# Patient Record
Sex: Male | Born: 2000
Health system: Southern US, Community
[De-identification: ages and names within clinical notes are randomized; demographics above are authoritative.]

## PROBLEM LIST (undated history)

## (undated) DIAGNOSIS — J45909 Unspecified asthma, uncomplicated: Secondary | ICD-10-CM

## (undated) DIAGNOSIS — F32A Depression, unspecified: Secondary | ICD-10-CM

## (undated) DIAGNOSIS — Z8619 Personal history of other infectious and parasitic diseases: Secondary | ICD-10-CM

## (undated) HISTORY — DX: Personal history of other infectious and parasitic diseases: Z86.19

## (undated) HISTORY — DX: Depression, unspecified: F32.A

## (undated) HISTORY — PX: DENTAL SURGERY: SHX609

## (undated) HISTORY — DX: Unspecified asthma, uncomplicated: J45.909

---
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
## 2019-06-01 ENCOUNTER — Encounter: Payer: Self-pay | Admitting: Family Medicine

## 2019-06-01 ENCOUNTER — Ambulatory Visit: Payer: BLUE CROSS/BLUE SHIELD | Admitting: Family Medicine

## 2019-06-01 ENCOUNTER — Ambulatory Visit (INDEPENDENT_AMBULATORY_CARE_PROVIDER_SITE_OTHER): Payer: BC Managed Care – PPO

## 2019-06-01 ENCOUNTER — Telehealth: Payer: Self-pay | Admitting: *Deleted

## 2019-06-01 ENCOUNTER — Other Ambulatory Visit: Payer: Self-pay

## 2019-06-01 DIAGNOSIS — Z23 Encounter for immunization: Secondary | ICD-10-CM | POA: Diagnosis not present

## 2019-06-01 DIAGNOSIS — M25532 Pain in left wrist: Secondary | ICD-10-CM

## 2019-06-01 DIAGNOSIS — S0081XA Abrasion of other part of head, initial encounter: Secondary | ICD-10-CM | POA: Diagnosis not present

## 2019-06-01 DIAGNOSIS — S6992XA Unspecified injury of left wrist, hand and finger(s), initial encounter: Secondary | ICD-10-CM | POA: Diagnosis not present

## 2019-06-01 DIAGNOSIS — S62102A Fracture of unspecified carpal bone, left wrist, initial encounter for closed fracture: Secondary | ICD-10-CM | POA: Diagnosis not present

## 2019-06-01 NOTE — Telephone Encounter (Signed)
Xray report ready.  Provider aware.

## 2019-06-01 NOTE — Telephone Encounter (Signed)
Diane : Call report L wrist  X-ray is in chart
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
## 2019-06-01 NOTE — Patient Instructions (Addendum)
There is a small chip of the bone on your distal radius/forearm bone at the wrist.  I will refer you to orthopedics to decide if casting or other treatment besides brace is necessary.  Keep the brace in place until seen by orthopedics.  Over-the-counter Tylenol or ibuprofen if needed for pain, ice the area off and on for 10 minutes at a time for the next few days as needed for swelling, and elevate arm when seated.  See information below on abrasions.  Soap and water declines those areas and watch for signs of infection.  Thank you for coming in today.  Stay safe.    Abrasion  An abrasion is a cut or a scrape on the outer surface of the skin. An abrasion does not go through all the layers of the skin. It is important to care for your abrasion properly to prevent infection. What are the causes? This condition is caused by falling on or gliding across the ground or another surface. When your skin rubs on something, the outer and inner layers of skin may rub off. What are the signs or symptoms? The main symptom of this condition is a cut or a scrape. The scrape may be bleeding, or it may appear red or pink. If the abrasion was caused by a fall, there may be a bruise under the cut or scrape. How is this diagnosed? An abrasion is diagnosed with a physical exam. How is this treated? Treatment for this condition depends on how large and deep the abrasion is. In most cases:  Your abrasion will be cleaned with water and mild soap. This is done to remove any dirt or debris (such as particles of glass or rock) that may be stuck in the wound.  An antibiotic ointment may be applied to the abrasion to help prevent infection.  A bandage (dressing) may be placed on the abrasion to keep it clean. You may also need a tetanus shot. Follow these instructions at home: Medicines  Take or apply over-the-counter and prescription medicines only as told by your health care provider.  If you were prescribed an  antibiotic medicine, apply it as told by your health care provider. Wound care  Clean the wound 2-3 times a day, or as directed by your health care provider. To do this, wash the wound with mild soap and water, rinse off the soap, and pat the wound dry with a clean towel. Do not rub the wound.  Keep the dressing clean and dry as told by your health care provider.  There are many different ways to close and cover a wound. Follow instructions from your health care provider about: ? Caring for your wound. ? Changing and removing your dressing. You may have to change your dressing one or more times a day, or as directed by your health care provider.  Check your wound every day for signs of infection. Check for: ? Redness, particularly a red streak that spreads out from the wound. ? Swelling or increased pain. ? Warmth. ? Fluid, pus, or a bad smell.  If directed, put ice on the injured area to reduce pain and swelling: ? Put ice in a plastic bag. ? Place a towel between your skin and the bag. ? Leave the ice on for 20 minutes, 2-3 times a day. General instructions  Do not take baths, swim, or use a hot tub until your health care provider says it is okay to do so.  If possible, raise (elevate)  the injured area above the level of your heart while you are sitting or lying down. This will reduce pain and swelling.  Keep all follow-up visits as directed by your health care provider. This is important. Contact a health care provider if:  You received a tetanus shot, and you have swelling, severe pain, redness, or bleeding at the injection site.  Your pain is not controlled with medicine.  You have redness, swelling, or more pain at the site of your wound. Get help right away if:  You have a red streak spreading away from your wound.  You have a fever.  You have fluid, blood, or pus coming from your wound.  You notice a bad smell coming from your wound or your dressing. Summary  An  abrasion is a cut or a scrape on the outer surface of the skin. An abrasion does not go through all the layers of the skin.  Care for your abrasion properly to prevent infection.  Clean the wound with mild soap and water 2-3 times a day. Follow instructions from your health care provider about taking medicines and changing your bandage (dressing).  Contact your health care provider if you have redness, swelling or more pain in the wound area.  Get help right away if you have a fever or if you have fluid, blood, pus, a bad smell, or a red streak coming from the wound. This information is not intended to replace advice given to you by your health care provider. Make sure you discuss any questions you have with your health care provider. Document Released: 07/29/2005 Document Revised: 10/01/2017 Document Reviewed: 06/02/2017 Elsevier Patient Education  2020 [employer] Inc[employer]  Wrist Splint, Adult A wrist splint is a device that prevents your wrist from moving. A splint supports your wrist like a cast, but it is more flexible. It can be removed or loosened. The supporting part of a splint does not completely surround your wrist. It is held in place with an elastic band or straps. You may need a wrist splint if you have hurt your wrist or if you have a condition that causes swelling. Depending on the type of wrist problem you have, your splint may extend up your arm, onto your hand, or around your thumb. The wrist splint may be worn to:  Support your wrist.  Protect your injury.  Prevent further injury.  Prevent movement.  Reduce pain.  Help with healing. It is important to follow instructions from your health care provider about when to wear the splint to make sure your wrist heals correctly. What are the risks? The most dangerous complication of wearing a splint is having a reduced blood supply to your wrist or hand. This can happen if there is a lot of swelling or if the splint is too tight.  Limited blood supply results in a condition called compartment syndrome and can cause permanent damage. Symptoms include:  Pain that is getting worse.  Tingling and numbness.  Changes in skin color, including paleness or a bluish color.  Cold fingers. Other complications of wearing a splint can include:  Skin irritation that can cause: ? Itching. ? Rash. ? Skin sores. ? Skin infection.  Wrist stiffness. This can occur if you have worn a splint for a long time.  Wrist weakness. How to use your wrist splint  Your wrist splint should be tight enough to support your wrist without blocking your blood supply. How long you need to wear the splint depends on  the type of wrist problem you have. Your health care provider will instruct you about how to wear your wrist splint and how long to wear it. Splint wear  Wear the splint as told by your health care provider. Remove it only as told by your health care provider.  Loosen the splint if your fingers tingle, become numb, or turn cold and blue.  Keep the splint clean.  If the splint is not waterproof: ? Do not let it get wet. ? Cover it with a watertight covering when you take a bath or a shower.  Do not stick anything inside the splint to scratch your skin. Doing that increases your risk of infection.  Check the skin under the splint for any redness or blisters every time you take off the splint. Tell your health care provider about any concerns. Managing pain, stiffness, and swelling  If directed, put ice on the injured area. ? If you a have a removable splint, remove it as told by your health care provider. ? Put ice in a plastic bag. ? Place a towel between your skin and the bag. ? Leave the ice on for 20 minutes, 2-3 times a day.  Move your fingers often to avoid stiffness and to lessen swelling.  Raise (elevate) the injured area above the level of your heart while you are sitting or lying down. Activity  Return to your  normal activities as told by your health care provider. Ask your health care provider what activities are safe for you.  Do exercises as told by your health care provider.  Ask your health care provider when it is safe to drive with a splint on your wrist. General instructions  Do not use the injured limb to support (bear) your body weight until your health care provider says that you can.  Do not put pressure on any part of the splint until it is fully hardened. This may take several hours.  Do not use any products that contain nicotine or tobacco, such as cigarettes and e-cigarettes. If you need help quitting, ask your health care provider.  Take over-the-counter and prescription medicines only as told by your health care provider.  Keep all follow-up visits as told by your health care provider. This is important. Contact a health care provider if:  You have wrist pain or swelling that does not go away.  The skin around or under your splint becomes red, itchy, or moist.  You have chills or a fever.  Your splint feels too tight or too loose.  Your splint gets damaged. Get help right away if:  You have pain that is getting worse.  You have tingling and numbness.  You have changes in skin color, including paleness or a bluish color.  Your fingers are cold. Summary  A wrist splint is a flexible device that supports your wrist and prevents it from moving.  Follow instructions from your health care provider about when to wear the splint to make sure your wrist heals correctly.  Icing, moving your fingers, and raising (elevating) your wrist above the level of your heart will help you manage pain, stiffness, and swelling.  The most dangerous complication of wearing a splint is having a reduced blood supply to your wrist or hand. If your fingers tingle, become numb, or turn cold and blue, loosen the splint and get help right away. This information is not intended to replace  advice given to you by your health care provider. Make sure  you discuss any questions you have with your health care provider. Document Released: 10/01/2006 Document Revised: 02/06/2019 Document Reviewed: 01/06/2017 Elsevier Patient Education  [employer]2020 [employer]   If you have lab work done today you will be contacted with your lab results within the next 2 weeks.  If you have not heard from us then please contact us. The fastest way to get your results is to register for My Chart.   IF you received an x-ray today, you will receive an invoice from [facility]. Please contact [facility] at [phone] with questions or concerns regarding your invoice.   IF you received labwork today, you will receive an invoice from [facility]. Please contact [facility] at [phone] with questions or concerns regarding your invoice.   Our billing staff will not be able to assist you with questions regarding bills from these companies.  You will be contacted with the lab results as soon as they are available. The fastest way to get your results is to activate your My Chart account. Instructions are located on the last page of this paperwork. If you have not heard from us regarding the results in 2 weeks, please contact this office.

## 2019-06-07 ENCOUNTER — Encounter: Payer: Self-pay | Admitting: Orthopaedic Surgery

## 2019-06-07 ENCOUNTER — Ambulatory Visit (INDEPENDENT_AMBULATORY_CARE_PROVIDER_SITE_OTHER): Payer: BC Managed Care – PPO | Admitting: Orthopaedic Surgery

## 2019-06-07 VITALS — Ht 72.0 in | Wt 238.0 lb

## 2019-06-07 DIAGNOSIS — S52501A Unspecified fracture of the lower end of right radius, initial encounter for closed fracture: Secondary | ICD-10-CM | POA: Insufficient documentation

## 2019-06-07 DIAGNOSIS — S52571A Other intraarticular fracture of lower end of right radius, initial encounter for closed fracture: Secondary | ICD-10-CM | POA: Diagnosis not present
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
## 2019-07-11 ENCOUNTER — Ambulatory Visit: Payer: BC Managed Care – PPO | Admitting: Orthopaedic Surgery

## 2019-07-13 ENCOUNTER — Other Ambulatory Visit: Payer: Self-pay

## 2019-07-13 DIAGNOSIS — Z20822 Contact with and (suspected) exposure to covid-19: Secondary | ICD-10-CM

## 2019-07-13 DIAGNOSIS — R6889 Other general symptoms and signs: Secondary | ICD-10-CM | POA: Diagnosis not present

## 2019-07-14 ENCOUNTER — Ambulatory Visit: Payer: BC Managed Care – PPO | Admitting: Orthopaedic Surgery

## 2019-07-14 LAB — NOVEL CORONAVIRUS, NAA: SARS-CoV-2, NAA: NOT DETECTED

## 2019-09-25 DIAGNOSIS — Z20828 Contact with and (suspected) exposure to other viral communicable diseases: Secondary | ICD-10-CM | POA: Diagnosis not present

## 2019-10-06 DIAGNOSIS — Z9189 Other specified personal risk factors, not elsewhere classified: Secondary | ICD-10-CM | POA: Diagnosis not present

## 2019-10-06 DIAGNOSIS — Z20828 Contact with and (suspected) exposure to other viral communicable diseases: Secondary | ICD-10-CM | POA: Diagnosis not present

## 2019-12-08 DIAGNOSIS — H900 Conductive hearing loss, bilateral: Secondary | ICD-10-CM | POA: Diagnosis not present

## 2019-12-08 DIAGNOSIS — H6123 Impacted cerumen, bilateral: Secondary | ICD-10-CM | POA: Diagnosis not present

## 2019-12-14 DIAGNOSIS — R05 Cough: Secondary | ICD-10-CM | POA: Diagnosis not present

## 2020-02-15 ENCOUNTER — Ambulatory Visit: Payer: BC Managed Care – PPO | Admitting: Family Medicine

## 2020-02-29 ENCOUNTER — Other Ambulatory Visit: Payer: Self-pay

## 2020-02-29 ENCOUNTER — Ambulatory Visit: Payer: BC Managed Care – PPO | Admitting: Family Medicine

## 2020-02-29 ENCOUNTER — Encounter: Payer: Self-pay | Admitting: Family Medicine

## 2020-02-29 VITALS — BP 122/80 | HR 82 | Temp 98.5°F | Ht 72.0 in | Wt 241.0 lb

## 2020-02-29 DIAGNOSIS — S00411A Abrasion of right ear, initial encounter: Secondary | ICD-10-CM | POA: Diagnosis not present

## 2020-02-29 DIAGNOSIS — H6121 Impacted cerumen, right ear: Secondary | ICD-10-CM | POA: Diagnosis not present

## 2020-02-29 MED ORDER — OFLOXACIN 0.3 % OT SOLN: 5.0000 [drp] | Freq: Every day | OTIC | 0 refills | Status: DC

## 2020-02-29 NOTE — Patient Instructions (Addendum)
There is a small abrasion on the outside part of the ear canal. Use 5 drops of antibiotic once per day for the next week. If any increased pain, swelling, headaches, or worsening symptoms return for recheck here or urgent care/emergency room if needed.  Small amount of wax was removed from the left ear, but I do not expect that to be an issue. Try to avoid use of Q-tips within the ear canal itself. If blockages from ear wax in the future, can use over-the-counter Debrox or we can treat in our office again.  Return to the clinic or go to the nearest emergency room if any of your symptoms worsen or new symptoms occur.    Earwax Buildup, Adult The ears produce a substance called earwax that helps keep bacteria out of the ear and protects the skin in the ear canal. Occasionally, earwax can build up in the ear and cause discomfort or hearing loss. What increases the risk? This condition is more likely to develop in people who:  Are male.  Are elderly.  Naturally produce more earwax.  Clean their ears often with cotton swabs.  Use earplugs often.  Use in-ear headphones often.  Wear hearing aids.  Have narrow ear canals.  Have earwax that is overly thick or sticky.  Have eczema.  Are dehydrated.  Have excess hair in the ear canal. What are the signs or symptoms? Symptoms of this condition include:  Reduced or muffled hearing.  A feeling of fullness in the ear or feeling that the ear is plugged.  Fluid coming from the ear.  Ear pain.  Ear itch.  Ringing in the ear.  Coughing.  An obvious piece of earwax that can be seen inside the ear canal. How is this diagnosed? This condition may be diagnosed based on:  Your symptoms.  Your medical history.  An ear exam. During the exam, your health care provider will look into your ear with an instrument called an otoscope. You may have tests, including a hearing test. How is this treated? This condition may be treated  by:  Using ear drops to soften the earwax.  Having the earwax removed by a health care provider. The health care provider may: ? Flush the ear with water. ? Use an instrument that has a loop on the end (curette). ? Use a suction device.  Surgery to remove the wax buildup. This may be done in severe cases. Follow these instructions at home:   Take over-the-counter and prescription medicines only as told by your health care provider.  Do not put any objects, including cotton swabs, into your ear. You can clean the opening of your ear canal with a washcloth or facial tissue.  Follow instructions from your health care provider about cleaning your ears. Do not over-clean your ears.  Drink enough fluid to keep your urine clear or pale yellow. This will help to thin the earwax.  Keep all follow-up visits as told by your health care provider. If earwax builds up in your ears often or if you use hearing aids, consider seeing your health care provider for routine, preventive ear cleanings. Ask your health care provider how often you should schedule your cleanings.  If you have hearing aids, clean them according to instructions from the manufacturer and your health care provider. Contact a health care provider if:  You have ear pain.  You develop a fever.  You have blood, pus, or other fluid coming from your ear.  You  have hearing loss.  You have ringing in your ears that does not go away.  Your symptoms do not improve with treatment.  You feel like the room is spinning (vertigo). Summary  Earwax can build up in the ear and cause discomfort or hearing loss.  The most common symptoms of this condition include reduced or muffled hearing and a feeling of fullness in the ear or feeling that the ear is plugged.  This condition may be diagnosed based on your symptoms, your medical history, and an ear exam.  This condition may be treated by using ear drops to soften the earwax or by  having the earwax removed by a health care provider.  Do not put any objects, including cotton swabs, into your ear. You can clean the opening of your ear canal with a washcloth or facial tissue. This information is not intended to replace advice given to you by your health care provider. Make sure you discuss any questions you have with your health care provider. Document Revised: 10/01/2017 Document Reviewed: 12/30/2016 Elsevier Patient Education  [employer].   If you have lab work done today you will be contacted with your lab results within the next 2 weeks.  If you have not heard from [place] then please contact us. The fastest way to get your results is to register for My Chart.   IF you received an x-ray today, you will receive an invoice from [facility]. Please contact [facility] at [phone] with questions or concerns regarding your invoice.   IF you received labwork today, you will receive an invoice from [place]. Please contact [facility] at [phone] with questions or concerns regarding your invoice.   Our billing staff will not be able to assist you with questions regarding bills from these companies.  You will be contacted with the lab results as soon as they are available. The fastest way to get your results is to activate your My Chart account. Instructions are located on the last page of this paperwork. If you have not heard from [place] regarding the results in 2 weeks, please contact this office.

## 2020-02-29 NOTE — Progress Notes (Signed)
Subjective:  Patient ID: James Dawson, male    DOB: May 16, 2001  Age: 19 y.o. MRN: [id number]  CC:  Chief Complaint  Patient presents with  . ear cloged    pt states his R ear is cloged. pt tried to clear ear with a Q-tip and casued some bleeding. pt is having a hard time hearing out of R ear. and feels some pressure in and around the ear. no discharge or fluid habe came out of ear. pt dose have mini headaches shortly after the ear was clogged.    HPI James Dawson presents for   R ear clogged: Started 3 days ago - min blockage - used qtip to clean. smal amount of blood noted - thought he may have scratched. Bleeding stopped in , not returned. No other d/c.  R ear muffled since 2 days ago. - same past 2 days. No prior tympanostomy tube or trauma.  Occasional slight R sided HA.  No fever. 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       History Patient Active Problem List   Diagnosis Date Noted  . Closed fracture of right distal radius 06/07/2019   Past Medical History:  Diagnosis Date  . Asthma    Past Surgical History:  Procedure Laterality Date  . DENTAL SURGERY     No Known Allergies Prior to Admission medications   Not on File   Social History   Socioeconomic History  . Marital status: Significant Other    Spouse name: Not on file  . Number of children: Not on file  . Years of education: Not on file  . Highest education level: Not on file  Occupational History  . Not on file  Tobacco Use  . Smoking status: Never Smoker  . Smokeless tobacco: Never Used  Substance and Sexual Activity  . Alcohol use: No  . Drug use: No  . Sexual activity: Not on file  Other Topics Concern  . Not on file  Social History Narrative  . Not on file   Social Determinants of Health   Financial Resource Strain:   . Difficulty of Paying Living Expenses:   Food Insecurity:   . Worried About Programme researcher, broadcasting/film/video in the Last Year:   . Barista  in the Last Year:   Transportation Needs:   . Freight forwarder (Medical):   Marland Kitchen Lack of Transportation (Non-Medical):   Physical Activity:   . Days of Exercise per Week:   . Minutes of Exercise per Session:   Stress:   . Feeling of Stress :   Social Connections:   . Frequency of Communication with Friends and Family:   . Frequency of Social Gatherings with Friends and Family:   . Attends Religious Services:   . Active Member of Clubs or Organizations:   . Attends Banker Meetings:   Marland Kitchen Marital Status:   Intimate Partner Violence:   . Fear of Current or Ex-Partner:   . Emotionally Abused:   Marland Kitchen Physically Abused:   . Sexually Abused:     Review of Systems  Constitutional: Negative for fever.  HENT: Positive for hearing loss. Negative for ear discharge, ear pain and facial swelling.      Objective:   Vitals:   02/29/20 1410  BP: 122/80  Pulse: 82  Temp: 98.5 F (36.9 C)  TempSrc: Temporal  SpO2: 98%  Weight: 241 lb (109.3 kg)  Height: 6' (1.829 m)  Physical Exam Constitutional:      General: He is not in acute distress.    Appearance: He is well-developed.  HENT:     Head: Normocephalic and atraumatic.     Ears:     Comments: Left external ear, nontender minimal dark cerumen in canal, nonobstructive. Small amount centrally was removed with white curette without difficulty, no complications.  Right ear, pinna nontender, no pain with traction. No surrounding lymphadenopathy or erythema. No rash. Canal with small abrasion, dried blood at approximately 3:00 in the external canal. Canal obstructed with light yellow cerumen. No apparent canal edema or exudate. Unable to visualize TM Cardiovascular:     Rate and Rhythm: Normal rate.  Pulmonary:     Effort: Pulmonary effort is normal.  Neurological:     Mental Status: He is alert and oriented to person, place, and time.     3:52 PM Exam after cerumen lavage: He has regained normal hearing on  right. Small abrasion remains at 3:00, TM pearly gray without sign of rupture or injury.   Assessment & Plan:  James Dawson is a 19 y.o. male . Hearing loss of right ear due to cerumen impaction - Plan: Ear wax removal  Abrasion of right ear canal, initial encounter - Plan: ofloxacin (FLOXIN OTIC) 0.3 % OTIC solution Cerumen impaction with small abrasion while trying to clean at home.  No sign of tympanic membrane rupture or injury.  Lavage performed in office without complication, resolution of hearing loss.  Floxin otic for abrasion with RTC precautions.  Meds ordered this encounter  Medications  . ofloxacin (FLOXIN OTIC) 0.3 % OTIC solution    Sig: Place 5 drops into the left ear daily.    Dispense:  5 mL    Refill:  0   Patient Instructions     There is a small abrasion on the outside part of the ear canal. Use 5 drops of antibiotic once per day for the next week. If any increased pain, swelling, headaches, or worsening symptoms return for recheck here or urgent care/emergency room if needed.  Small amount of wax was removed from the left ear, but I do not expect that to be an issue. Try to avoid use of Q-tips within the ear canal itself. If blockages from ear wax in the future, can use over-the-counter Debrox or we can treat in our office again.  Return to the clinic or go to the nearest emergency room if any of your symptoms worsen or new symptoms occur.    Earwax Buildup, Adult The ears produce a substance called earwax that helps keep bacteria out of the ear and protects the skin in the ear canal. Occasionally, earwax can build up in the ear and cause discomfort or hearing loss. What increases the risk? This condition is more likely to develop in people who:  Are male.  Are elderly.  Naturally produce more earwax.  Clean their ears often with cotton swabs.  Use earplugs often.  Use in-ear headphones often.  Wear hearing aids.  Have narrow ear canals.  Have  earwax that is overly thick or sticky.  Have eczema.  Are dehydrated.  Have excess hair in the ear canal. What are the signs or symptoms? Symptoms of this condition include:  Reduced or muffled hearing.  A feeling of fullness in the ear or feeling that the ear is plugged.  Fluid coming from the ear.  Ear pain.  Ear itch.  Ringing in the ear.  Coughing.  An obvious piece of earwax that can be seen inside the ear canal. How is this diagnosed? This condition may be diagnosed based on:  Your symptoms.  Your medical history.  An ear exam. During the exam, your health care provider will look into your ear with an instrument called an otoscope. You may have tests, including a hearing test. How is this treated? This condition may be treated by:  Using ear drops to soften the earwax.  Having the earwax removed by a health care provider. The health care provider may: ? Flush the ear with water. ? Use an instrument that has a loop on the end (curette). ? Use a suction device.  Surgery to remove the wax buildup. This may be done in severe cases. Follow these instructions at home:   Take over-the-counter and prescription medicines only as told by your health care provider.  Do not put any objects, including cotton swabs, into your ear. You can clean the opening of your ear canal with a washcloth or facial tissue.  Follow instructions from your health care provider about cleaning your ears. Do not over-clean your ears.  Drink enough fluid to keep your urine clear or pale yellow. This will help to thin the earwax.  Keep all follow-up visits as told by your health care provider. If earwax builds up in your ears often or if you use hearing aids, consider seeing your health care provider for routine, preventive ear cleanings. Ask your health care provider how often you should schedule your cleanings.  If you have hearing aids, clean them according to instructions from the  manufacturer and your health care provider. Contact a health care provider if:  You have ear pain.  You develop a fever.  You have blood, pus, or other fluid coming from your ear.  You have hearing loss.  You have ringing in your ears that does not go away.  Your symptoms do not improve with treatment.  You feel like the room is spinning (vertigo). Summary  Earwax can build up in the ear and cause discomfort or hearing loss.  The most common symptoms of this condition include reduced or muffled hearing and a feeling of fullness in the ear or feeling that the ear is plugged.  This condition may be diagnosed based on your symptoms, your medical history, and an ear exam.  This condition may be treated by using ear drops to soften the earwax or by having the earwax removed by a health care provider.  Do not put any objects, including cotton swabs, into your ear. You can clean the opening of your ear canal with a washcloth or facial tissue. This information is not intended to replace advice given to you by your health care provider. Make sure you discuss any questions you have with your health care provider. Document Revised: 10/01/2017 Document Reviewed: 12/30/2016 Elsevier Patient Education  [employer].   If you have lab work done today you will be contacted with your lab results within the next 2 weeks.  If you have not heard from [place] then please contact us. The fastest way to get your results is to register for My Chart.   IF you received an x-ray today, you will receive an invoice from [facility]. Please contact [facility] at [phone] with questions or concerns regarding your invoice.   IF you received labwork today, you will receive an invoice from [facility]. Please contact [facility] at [phone] with questions or concerns regarding your  invoice.   Our billing staff will not be able to assist you with questions regarding bills from these  companies.  You will be contacted with the lab results as soon as they are available. The fastest way to get your results is to activate your My Chart account. Instructions are located on the last page of this paperwork. If you have not heard from [place] regarding the results in 2 weeks, please contact this office.         Signed, Merri Ray, MD [facility]

## 2020-03-14 ENCOUNTER — Ambulatory Visit: Payer: BC Managed Care – PPO | Admitting: Family Medicine

## 2020-03-21 ENCOUNTER — Ambulatory Visit: Payer: BC Managed Care – PPO | Admitting: Family Medicine

## 2020-04-26 ENCOUNTER — Telehealth: Payer: BC Managed Care – PPO | Admitting: Family Medicine

## 2020-05-02 ENCOUNTER — Other Ambulatory Visit: Payer: Self-pay

## 2020-05-02 ENCOUNTER — Ambulatory Visit: Payer: BC Managed Care – PPO | Admitting: Family Medicine

## 2020-05-02 ENCOUNTER — Encounter: Payer: Self-pay | Admitting: Family Medicine

## 2020-05-02 VITALS — BP 126/82 | HR 90 | Temp 97.8°F | Ht 72.0 in | Wt 246.0 lb

## 2020-05-02 DIAGNOSIS — H6002 Abscess of left external ear: Secondary | ICD-10-CM | POA: Diagnosis not present

## 2020-05-02 MED ORDER — DOXYCYCLINE HYCLATE 100 MG PO TABS: 100.0000 mg | ORAL_TABLET | Freq: Two times a day (BID) | ORAL | 0 refills | Status: DC

## 2020-05-02 NOTE — Progress Notes (Signed)
Subjective:  Patient ID: James Dawson, male    DOB: 2001/10/11  Age: 19 y.o. MRN: [id number]  CC:  Chief Complaint  Patient presents with  . Ear Pain    Pt reports he thinks he has an ingrown hair In his L ear that is causing wax build-up. Pt states his ear is tender to the touch and the L side of his jaw hurts when he opens his mouth.    HPI James Dawson presents for   Noticed pimple on L ear few days ago - inside/front part. Area popped open while waiting in office, blood after small amount Hearing ok.  No fevers.  No face swelling. Sore to jaw area at TNL only. Min tightness.   Tx: leftover floxin otic. Few drops once.   History Patient Active Problem List   Diagnosis Date Noted  . Closed fracture of right distal radius 06/07/2019   Past Medical History:  Diagnosis Date  . Asthma    Past Surgical History:  Procedure Laterality Date  . DENTAL SURGERY     No Known Allergies Prior to Admission medications   Medication Sig Start Date End Date Taking? Authorizing Provider  ofloxacin (FLOXIN OTIC) 0.3 % OTIC solution Place 5 drops into the left ear daily. 02/29/20   Shade Flood, MD   Social History   Socioeconomic History  . Marital status: Significant Other    Spouse name: Not on file  . Number of children: Not on file  . Years of education: Not on file  . Highest education level: Not on file  Occupational History  . Not on file  Tobacco Use  . Smoking status: Never Smoker  . Smokeless tobacco: Never Used  Substance and Sexual Activity  . Alcohol use: No  . Drug use: No  . Sexual activity: Not on file  Other Topics Concern  . Not on file  Social History Narrative  . Not on file   Social Determinants of Health   Financial Resource Strain:   . Difficulty of Paying Living Expenses:   Food Insecurity:   . Worried About Programme researcher, broadcasting/film/video in the Last Year:   . Barista in the Last Year:   Transportation Needs:   . Freight forwarder  (Medical):   Marland Kitchen Lack of Transportation (Non-Medical):   Physical Activity:   . Days of Exercise per Week:   . Minutes of Exercise per Session:   Stress:   . Feeling of Stress :   Social Connections:   . Frequency of Communication with Friends and Family:   . Frequency of Social Gatherings with Friends and Family:   . Attends Religious Services:   . Active Member of Clubs or Organizations:   . Attends Banker Meetings:   Marland Kitchen Marital Status:   Intimate Partner Violence:   . Fear of Current or Ex-Partner:   . Emotionally Abused:   Marland Kitchen Physically Abused:   . Sexually Abused:     Review of Systems Per HPI.   Objective:   Vitals:   05/02/20 1413 05/02/20 1416  BP: (!) 144/85 126/82  Pulse: 90   Temp: 97.8 F (36.6 C)   TempSrc: Temporal   SpO2: 97%   Weight: 246 lb (111.6 kg)   Height: 6' (1.829 m)      Physical Exam Constitutional:      General: He is not in acute distress.    Appearance: He is well-developed.  HENT:  Head: Normocephalic and atraumatic.     Ears:     Comments: Small pustule just inside of the canal on the left, anterior aspect.  No appreciable fluctuance, no discharge with pressure.  Minimal tenderness palpation.  TMJ without focal bony tenderness, able to open and close jaw without difficulty.  No face swelling or erythema. Cardiovascular:     Rate and Rhythm: Normal rate.  Pulmonary:     Effort: Pulmonary effort is normal.  Neurological:     Mental Status: He is alert and oriented to person, place, and time.        Assessment & Plan:  James Dawson is a 19 y.o. male . Abscess of left ear canal - Plan: doxycycline (VIBRA-TABS) 100 MG tablet Suspected mild/early abscess of left canal that ruptured on its own in the office.  No active drainage or discharge at this time.  Cover with doxycycline 100 g twice daily x1 week.  Side effects discussed.  Warm compresses, symptomatic care with RTC/ER/urgent care precautions.  Meds ordered  this encounter  Medications  . doxycycline (VIBRA-TABS) 100 MG tablet    Sig: Take 1 tablet (100 mg total) by mouth 2 (two) times daily.    Dispense:  14 tablet    Refill:  0   Patient Instructions   I suspect you had a small abscess forming just inside the ear canal.  It appears to be improving at this time.  Okay to apply warm compresses to area few times per day with gentle pressure to express any remaining pus.  Start antibiotic 1 pill twice per day.  If any increasing swelling or worsening symptoms be seen here or urgent care/emergency room.  Let me know if there are questions and have a safe trip  Skin Abscess  A skin abscess is an infected area on or under your skin that contains a collection of pus and other material. An abscess may also be called a furuncle, carbuncle, or boil. An abscess can occur in or on almost any part of your body. Some abscesses break open (rupture) on their own. Most continue to get worse unless they are treated. The infection can spread deeper into the body and eventually into your blood, which can make you feel ill. Treatment usually involves draining the abscess. What are the causes? An abscess occurs when germs, like bacteria, pass through your skin and cause an infection. This may be caused by:  A scrape or cut on your skin.  A puncture wound through your skin, including a needle injection or insect bite.  Blocked oil or sweat glands.  Blocked and infected hair follicles.  A cyst that forms beneath your skin (sebaceous cyst) and becomes infected. What increases the risk? This condition is more likely to develop in people who:  Have a weak body defense system (immune system).  Have diabetes.  Have dry and irritated skin.  Get frequent injections or use illegal IV drugs.  Have a foreign body in a wound, such as a splinter.  Have problems with their lymph system or veins. What are the signs or symptoms? Symptoms of this condition  include:  A painful, firm bump under the skin.  A bump with pus at the top. This may break through the skin and drain. Other symptoms include:  Redness surrounding the abscess site.  Warmth.  Swelling of the lymph nodes (glands) near the abscess.  Tenderness.  A sore on the skin. How is this diagnosed? This condition may be diagnosed  based on:  A physical exam.  Your medical history.  A sample of pus. This may be used to find out what is causing the infection.  Blood tests.  Imaging tests, such as an ultrasound, CT scan, or MRI. How is this treated? A small abscess that drains on its own may not need treatment. Treatment for larger abscesses may include:  Moist heat or heat pack applied to the area several times a day.  A procedure to drain the abscess (incision and drainage).  Antibiotic medicines. For a severe abscess, you may first get antibiotics through an IV and then change to antibiotics by mouth. Follow these instructions at home: Medicines   Take over-the-counter and prescription medicines only as told by your health care provider.  If you were prescribed an antibiotic medicine, take it as told by your health care provider. Do not stop taking the antibiotic even if you start to feel better. Abscess care   If you have an abscess that has not drained, apply heat to the affected area. Use the heat source that your health care provider recommends, such as a moist heat pack or a heating pad. ? Place a towel between your skin and the heat source. ? Leave the heat on for 20-30 minutes. ? Remove the heat if your skin turns bright red. This is especially important if you are unable to feel pain, heat, or cold. You may have a greater risk of getting burned.  Follow instructions from your health care provider about how to take care of your abscess. Make sure you: ? Cover the abscess with a bandage (dressing). ? Change your dressing or gauze as told by your health  care provider. ? Wash your hands with soap and water before you change the dressing or gauze. If soap and water are not available, use hand sanitizer.  Check your abscess every day for signs of a worsening infection. Check for: ? More redness, swelling, or pain. ? More fluid or blood. ? Warmth. ? More pus or a bad smell. General instructions  To avoid spreading the infection: ? Do not share personal care items, towels, or hot tubs with others. ? Avoid making skin contact with other people.  Keep all follow-up visits as told by your health care provider. This is important. Contact a health care provider if you have:  More redness, swelling, or pain around your abscess.  More fluid or blood coming from your abscess.  Warm skin around your abscess.  More pus or a bad smell coming from your abscess.  A fever.  Muscle aches.  Chills or a general ill feeling. Get help right away if you:  Have severe pain.  See red streaks on your skin spreading away from the abscess. Summary  A skin abscess is an infected area on or under your skin that contains a collection of pus and other material.  A small abscess that drains on its own may not need treatment.  Treatment for larger abscesses may include having a procedure to drain the abscess and taking an antibiotic. This information is not intended to replace advice given to you by your health care provider. Make sure you discuss any questions you have with your health care provider. Document Revised: 02/09/2019 Document Reviewed: 12/02/2017 Elsevier Patient Education  [employer].    If you have lab work done today you will be contacted with your lab results within the next 2 weeks.  If you have not heard from [place]  then please contact us. The fastest way to get your results is to register for My Chart.   IF you received an x-ray today, you will receive an invoice from [facility]. Please contact [facility]  at [phone] with questions or concerns regarding your invoice.   IF you received labwork today, you will receive an invoice from [facility]. Please contact [facility] at [phone] with questions or concerns regarding your invoice.   Our billing staff will not be able to assist you with questions regarding bills from these companies.  You will be contacted with the lab results as soon as they are available. The fastest way to get your results is to activate your My Chart account. Instructions are located on the last page of this paperwork. If you have not heard from us regarding the results in 2 weeks, please contact this office.         Signed, Meredith StaggersJeffrey Estalee Mccandlish, MD [facility]

## 2020-05-02 NOTE — Patient Instructions (Addendum)
I suspect you had a small abscess forming just inside the ear canal.  It appears to be improving at this time.  Okay to apply warm compresses to area few times per day with gentle pressure to express any remaining pus.  Start antibiotic 1 pill twice per day.  If any increasing swelling or worsening symptoms be seen here or urgent care/emergency room.  Let me know if there are questions and have a safe trip  Skin Abscess  A skin abscess is an infected area on or under your skin that contains a collection of pus and other material. An abscess may also be called a furuncle, carbuncle, or boil. An abscess can occur in or on almost any part of your body. Some abscesses break open (rupture) on their own. Most continue to get worse unless they are treated. The infection can spread deeper into the body and eventually into your blood, which can make you feel ill. Treatment usually involves draining the abscess. What are the causes? An abscess occurs when germs, like bacteria, pass through your skin and cause an infection. This may be caused by:  A scrape or cut on your skin.  A puncture wound through your skin, including a needle injection or insect bite.  Blocked oil or sweat glands.  Blocked and infected hair follicles.  A cyst that forms beneath your skin (sebaceous cyst) and becomes infected. What increases the risk? This condition is more likely to develop in people who:  Have a weak body defense system (immune system).  Have diabetes.  Have dry and irritated skin.  Get frequent injections or use illegal IV drugs.  Have a foreign body in a wound, such as a splinter.  Have problems with their lymph system or veins. What are the signs or symptoms? Symptoms of this condition include:  A painful, firm bump under the skin.  A bump with pus at the top. This may break through the skin and drain. Other symptoms include:  Redness surrounding the abscess site.  Warmth.  Swelling of the  lymph nodes (glands) near the abscess.  Tenderness.  A sore on the skin. How is this diagnosed? This condition may be diagnosed based on:  A physical exam.  Your medical history.  A sample of pus. This may be used to find out what is causing the infection.  Blood tests.  Imaging tests, such as an ultrasound, CT scan, or MRI. How is this treated? A small abscess that drains on its own may not need treatment. Treatment for larger abscesses may include:  Moist heat or heat pack applied to the area several times a day.  A procedure to drain the abscess (incision and drainage).  Antibiotic medicines. For a severe abscess, you may first get antibiotics through an IV and then change to antibiotics by mouth. Follow these instructions at home: Medicines   Take over-the-counter and prescription medicines only as told by your health care provider.  If you were prescribed an antibiotic medicine, take it as told by your health care provider. Do not stop taking the antibiotic even if you start to feel better. Abscess care   If you have an abscess that has not drained, apply heat to the affected area. Use the heat source that your health care provider recommends, such as a moist heat pack or a heating pad. ? Place a towel between your skin and the heat source. ? Leave the heat on for 20-30 minutes. ? Remove the heat if your  skin turns bright red. This is especially important if you are unable to feel pain, heat, or cold. You may have a greater risk of getting burned.  Follow instructions from your health care provider about how to take care of your abscess. Make sure you: ? Cover the abscess with a bandage (dressing). ? Change your dressing or gauze as told by your health care provider. ? Wash your hands with soap and water before you change the dressing or gauze. If soap and water are not available, use hand sanitizer.  Check your abscess every day for signs of a worsening infection.  Check for: ? More redness, swelling, or pain. ? More fluid or blood. ? Warmth. ? More pus or a bad smell. General instructions  To avoid spreading the infection: ? Do not share personal care items, towels, or hot tubs with others. ? Avoid making skin contact with other people.  Keep all follow-up visits as told by your health care provider. This is important. Contact a health care provider if you have:  More redness, swelling, or pain around your abscess.  More fluid or blood coming from your abscess.  Warm skin around your abscess.  More pus or a bad smell coming from your abscess.  A fever.  Muscle aches.  Chills or a general ill feeling. Get help right away if you:  Have severe pain.  See red streaks on your skin spreading away from the abscess. Summary  A skin abscess is an infected area on or under your skin that contains a collection of pus and other material.  A small abscess that drains on its own may not need treatment.  Treatment for larger abscesses may include having a procedure to drain the abscess and taking an antibiotic. This information is not intended to replace advice given to you by your health care provider. Make sure you discuss any questions you have with your health care provider. Document Revised: 02/09/2019 Document Reviewed: 12/02/2017 Elsevier Patient Education  [employer].    If you have lab work done today you will be contacted with your lab results within the next 2 weeks.  If you have not heard from [place] then please contact us. The fastest way to get your results is to register for My Chart.   IF you received an x-ray today, you will receive an invoice from [facility]. Please contact [facility] at [phone] with questions or concerns regarding your invoice.   IF you received labwork today, you will receive an invoice from [facility]. Please contact [facility] at [phone] with questions or concerns regarding  your invoice.   Our billing staff will not be able to assist you with questions regarding bills from these companies.  You will be contacted with the lab results as soon as they are available. The fastest way to get your results is to activate your My Chart account. Instructions are located on the last page of this paperwork. If you have not heard from [place] regarding the results in 2 weeks, please contact this office.

## 2020-07-26 ENCOUNTER — Other Ambulatory Visit: Payer: Self-pay

## 2020-07-26 ENCOUNTER — Telehealth (INDEPENDENT_AMBULATORY_CARE_PROVIDER_SITE_OTHER): Payer: BC Managed Care – PPO | Admitting: Family Medicine

## 2020-07-26 ENCOUNTER — Encounter: Payer: Self-pay | Admitting: Family Medicine

## 2020-07-26 VITALS — Temp 96.4°F | Ht 72.0 in | Wt 240.0 lb

## 2020-07-26 DIAGNOSIS — R05 Cough: Secondary | ICD-10-CM | POA: Diagnosis not present

## 2020-07-26 DIAGNOSIS — J069 Acute upper respiratory infection, unspecified: Secondary | ICD-10-CM | POA: Diagnosis not present

## 2020-07-26 NOTE — Progress Notes (Signed)
   Virtual Visit Note  I connected with patient on 07/26/20 at 906am by video epic and verified that I am speaking with the correct person using two identifiers. James Dawson is currently located at home and patient is currently with them during visit. The provider, Myles Lipps, MD is located in their office at time of visit.  I discussed the limitations, risks, security and privacy concerns of performing an evaluation and management service by telephone and the availability of in person appointments. I also discussed with the patient that there may be a patient responsible charge related to this service. The patient expressed understanding and agreed to proceed.   I provided 10 minutes of non-face-to-face time during this encounter.  Chief Complaint  Patient presents with  . Nasal Congestion    sinus congestion, and drainage. (going on since tue night)  . top of throat swelling    swollen and dry. Worse in the morning.     HPI ? Has been having cold sx since changes in weather He has been having nasal congestion, sore throat, coughing x 4 days Minimal sneezing and no itchy eyes No fever or chills, no SOB Mild ear stuffiness but from ear wax occ headaches, had body aches first 2 days  No loss of taste or smell Mild nausea first 2 days, resolved now No vomiting or diarrhea No sick contacts Took mucinex last night - helped Has not been to school or work since he got sick Has been vaccinated against covid Has not had this flu season vaccine   No Known Allergies  Prior to Admission medications   Not on File    Past Medical History:  Diagnosis Date  . Asthma     Past Surgical History:  Procedure Laterality Date  . DENTAL SURGERY      Social History   Tobacco Use  . Smoking status: Never Smoker  . Smokeless tobacco: Never Used  Substance Use Topics  . Alcohol use: No    Family History  Problem Relation Age of Onset  . Mental illness Sister      ROS Per hpi  Objective  Vitals as reported by the patient:  Temperature (!) 96.4 F (35.8 C), temperature source Oral, height 6' (1.829 m), weight 240 lb (108.9 kg).  GEN: AAOx3, NAD HEENT: [place]/AT, pupils are symmetrical, EOMI, non-icteric sclera Resp: breathing comfortably, speaking in full sentences Skin: no rashes noted, no pallor Psych: good eye contact, normal mood and affect   ASSESSMENT and PLAN  1. Acute upper respiratory infection Discussed supportive measures and RTC precautions. Patient given information for covid testing. Work/school excuse will be given until neg covid test or if positive meets 10 day quarantine guidelines.  FOLLOW-UP: prn   The above assessment and management plan was discussed with the patient. The patient verbalized understanding of and has agreed to the management plan. Patient is aware to call the clinic if symptoms persist or worsen. Patient is aware when to return to the clinic for a follow-up visit. Patient educated on when it is appropriate to go to the emergency department.     Myles Lipps, MD Primary Care at [facility] [address] [place], [place] [address] Ph.  [phone] Fax [phone]

## 2020-07-26 NOTE — Patient Instructions (Signed)
° ° ° °  If you have lab work done today you will be contacted with your lab results within the next 2 weeks.  If you have not heard from us then please contact us. The fastest way to get your results is to register for My Chart. ° ° °IF you received an x-ray today, you will receive an invoice from [facility]. Please contact [facility] at [phone] with questions or concerns regarding your invoice.  ° °IF you received labwork today, you will receive an invoice from [facility]. Please contact [facility] at [phone] with questions or concerns regarding your invoice.  ° °Our billing staff will not be able to assist you with questions regarding bills from these companies. ° °You will be contacted with the lab results as soon as they are available. The fastest way to get your results is to activate your My Chart account. Instructions are located on the last page of this paperwork. If you have not heard from us regarding the results in 2 weeks, please contact this office. °  ° ° ° °

## 2020-11-09 ENCOUNTER — Encounter: Payer: Self-pay | Admitting: Family Medicine

## 2020-12-18 ENCOUNTER — Ambulatory Visit: Payer: 59 | Admitting: Family Medicine

## 2020-12-18 ENCOUNTER — Other Ambulatory Visit: Payer: Self-pay

## 2020-12-18 ENCOUNTER — Encounter: Payer: Self-pay | Admitting: Family Medicine

## 2020-12-18 VITALS — BP 147/84 | HR 94 | Temp 98.7°F | Ht 72.0 in | Wt 259.0 lb

## 2020-12-18 DIAGNOSIS — H6123 Impacted cerumen, bilateral: Secondary | ICD-10-CM

## 2020-12-18 DIAGNOSIS — R599 Enlarged lymph nodes, unspecified: Secondary | ICD-10-CM | POA: Diagnosis not present

## 2020-12-18 MED ORDER — CIPROFLOXACIN-DEXAMETHASONE 0.3-0.1 % OT SUSP: 4.0000 [drp] | Freq: Two times a day (BID) | OTIC | 0 refills | Status: AC

## 2020-12-18 NOTE — Progress Notes (Signed)
2/16/20222:31 PM  James Dawson 26-Mar-2001, 20 y.o., male [id number]  Chief Complaint  Patient presents with  . left jaw swollen lymph node    Along with left ear pain x 2 days - recent exp to allergen     HPI:   Patient is a 20 y.o. male with no significant past medical history who presents today for swollen jaw and ear pain.  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 0 0 0 0 1  Number falls in past yr: 0 0 - - 0  Injury with Fall? 0 0 - - 1  Comment - - - - left side of face and left wrist  Follow up Falls evaluation completed Falls evaluation completed Falls evaluation completed Falls evaluation completed Falls evaluation completed     No Known Allergies  Prior to Admission medications   Not on File    Past Medical History:  Diagnosis Date  . Asthma     Past Surgical History:  Procedure Laterality Date  . DENTAL SURGERY      Social History   Tobacco Use  . Smoking status: Never Smoker  . Smokeless tobacco: Never Used  Substance Use Topics  . Alcohol use: No    Family History  Problem Relation Age of Onset  . Mental illness Sister     Review of Systems  Constitutional: Negative for chills, fever and malaise/fatigue.  HENT: Positive for ear pain. Negative for congestion, sinus pain and sore throat.   Eyes: Negative for blurred vision and double vision.  Respiratory: Negative for cough, shortness of breath and wheezing.    Cardiovascular: Negative for chest pain, palpitations and leg swelling.  Gastrointestinal: Negative for abdominal pain, blood in stool, constipation, diarrhea, heartburn, nausea and vomiting.  Genitourinary: Negative for dysuria, frequency and hematuria.  Musculoskeletal: Negative for back pain, joint pain and neck pain.  Skin: Negative for rash.  Neurological: Positive for headaches. Negative for dizziness and weakness.     OBJECTIVE:  Today's Vitals   12/18/20 1325  BP: (!) 147/84  Pulse: 94  Temp: 98.7 F (37.1 C)  SpO2: 98%  Weight: 259 lb (117.5 kg)  Height: 6' (1.829 m)   Body mass index is 35.13 kg/m.   Physical Exam Vitals reviewed.  Constitutional:      Appearance: Normal appearance.  HENT:     Head: Normocephalic and atraumatic.     Jaw: No tenderness or pain on movement.     Right Ear: External ear normal. There is impacted cerumen.     Left Ear: External ear normal. There is impacted cerumen.     Mouth/Throat:     Mouth: Mucous membranes are moist.     Dentition: Normal dentition.     Pharynx: Oropharynx is clear. No oropharyngeal exudate or posterior oropharyngeal erythema.     Tonsils: No tonsillar exudate or tonsillar abscesses.  Eyes:     Conjunctiva/sclera:  Conjunctivae normal.     Pupils: Pupils are equal, round, and reactive to light.  Neck:     Thyroid: No thyroid mass, thyromegaly or thyroid tenderness.  Cardiovascular:     Rate and Rhythm: Normal rate and regular rhythm.     Pulses: Normal pulses.     Heart sounds: Normal heart sounds. No murmur heard. No friction rub. No gallop.   Pulmonary:     Effort: Pulmonary effort is normal. No respiratory distress.     Breath sounds: Normal breath sounds. No stridor. No wheezing or rales.  Abdominal:     General: Bowel sounds are normal.     Palpations: Abdomen is soft.     Tenderness: There is no abdominal tenderness.  Musculoskeletal:     Cervical back: No rigidity. No pain with movement.  Normal range of motion.     Right lower leg: No edema.     Left lower leg: No edema.  Lymphadenopathy:     Head:     Right side of head: No submental, submandibular, tonsillar, preauricular, posterior auricular or occipital adenopathy.     Left side of head: Submandibular adenopathy present. No submental, tonsillar, preauricular, posterior auricular or occipital adenopathy.     Cervical: No cervical adenopathy.  Skin:    General: Skin is warm and dry.  Neurological:     General: No focal deficit present.     Mental Status: He is alert and oriented to person, place, and time.  Psychiatric:        Mood and Affect: Mood normal.        Behavior: Behavior normal.    Post irrigation TM intact, erythema noted to bilateral canals  No results found for this or any previous visit (from the past 24 hour(s)).  No results found.   ASSESSMENT and PLAN  Problem List Items Addressed This Visit   None   Visit Diagnoses    Enlarged lymph nodes    -  Primary   Relevant Orders   HIV Antibody (routine testing w rflx)   Mono, Qual W/Rflx if Negative   CBC with Differential   CMP14+EGFR   Bilateral impacted cerumen       Relevant Medications   ciprofloxacin-dexamethasone (CIPRODEX) OTIC suspension   Other Relevant Orders   Ear wax removal      Plan . Ear drops to bilateral ears bid x 7 days . Will follow up in 1 week . RTC/ED precautions discussed . May need Oral antibiotics, will follow up with labs   Return in about 1 week (around 12/25/2020).    Huston Foley Piper Albro, FNP-BC Primary Care at [facility] [place],  [address] Ph.  [phone] Fax [phone]

## 2020-12-18 NOTE — Patient Instructions (Addendum)
  Otitis Externa  Otitis externa is an infection of the outer ear canal. The outer ear canal is the area between the outside of the ear and the eardrum. Otitis externa is sometimes called swimmer's ear. What are the causes? Common causes of this condition include:  Swimming in dirty water.  Moisture in the ear.  An injury to the inside of the ear.  An object stuck in the ear.  A cut or scrape on the outside of the ear. What increases the risk? You are more likely to get this condition if you go swimming often. What are the signs or symptoms?  Itching in the ear. This is often the first symptom.  Swelling of the ear.  Redness in the ear.  Ear pain. The pain may get worse when you pull on your ear.  Pus coming from the ear. How is this treated? This condition may be treated with:  Antibiotic ear drops. These are often given for 10-14 days.  Medicines to reduce itching and swelling. Follow these instructions at home:  If you were given antibiotic ear drops, use them as told by your doctor. Do not stop using them even if your condition gets better.  Take over-the-counter and prescription medicines only as told by your doctor.  Avoid getting water in your ears as told by your doctor. You may be told to avoid swimming or water sports for a few days.  Keep all follow-up visits as told by your doctor. This is important. How is this prevented?  Keep your ears dry. Use the corner of a towel to dry your ears after you swim or bathe.  Try not to scratch or put things in your ear. Doing these things makes it easier for germs to grow in your ear.  Avoid swimming in lakes, dirty water, or pools that may not have the right amount of a chemical called chlorine. Contact a doctor if:  You have a fever.  Your ear is still red, swollen, or painful after 3 days.  You still have pus coming from your ear after 3 days.  Your redness, swelling, or pain gets worse.  You have a  really bad headache.  You have redness, swelling, pain, or tenderness behind your ear. Summary  Otitis externa is an infection of the outer ear canal.  Symptoms include pain, redness, and swelling of the ear.  If you were given antibiotic ear drops, use them as told by your doctor. Do not stop using them even if your condition gets better.  Try not to scratch or put things in your ear. This information is not intended to replace advice given to you by your health care provider. Make sure you discuss any questions you have with your health care provider. Document Revised: 03/25/2018 Document Reviewed: 03/25/2018 Elsevier Patient Education  2021 [employer]   If you have lab work done today you will be contacted with your lab results within the next 2 weeks.  If you have not heard from us then please contact us. The fastest way to get your results is to register for My Chart.   IF you received an x-ray today, you will receive an invoice from [facility]. Please contact [facility] at [phone] with questions or concerns regarding your invoice.   IF you received labwork today, you will receive an invoice from [facility]. Please contact [facility] at [phone] with questions or concerns regarding your invoice.   Our billing staff will not be able   to assist you with questions regarding bills from these companies.  You will be contacted with the lab results as soon as they are available. The fastest way to get your results is to activate your My Chart account. Instructions are located on the last page of this paperwork. If you have not heard from us regarding the results in 2 weeks, please contact this office.      

## 2020-12-19 ENCOUNTER — Ambulatory Visit: Payer: BC Managed Care – PPO | Admitting: Family Medicine

## 2020-12-19 LAB — CBC WITH DIFFERENTIAL/PLATELET
Basophils Absolute: 0.1 10*3/uL (ref 0.0–0.2)
Basos: 1 %
EOS (ABSOLUTE): 0.1 10*3/uL (ref 0.0–0.4)
Eos: 2 %
Hematocrit: 44.9 % (ref 37.5–51.0)
Hemoglobin: 15.9 g/dL (ref 13.0–17.7)
Immature Grans (Abs): 0 10*3/uL (ref 0.0–0.1)
Immature Granulocytes: 1 %
Lymphocytes Absolute: 2.3 10*3/uL (ref 0.7–3.1)
Lymphs: 32 %
MCH: 29.4 pg (ref 26.6–33.0)
MCHC: 35.4 g/dL (ref 31.5–35.7)
MCV: 83 fL (ref 79–97)
Monocytes Absolute: 0.4 10*3/uL (ref 0.1–0.9)
Monocytes: 6 %
Neutrophils Absolute: 4.3 10*3/uL (ref 1.4–7.0)
Neutrophils: 58 %
Platelets: 272 10*3/uL (ref 150–450)
RBC: 5.4 x10E6/uL (ref 4.14–5.80)
RDW: 12.6 % (ref 11.6–15.4)
WBC: 7.3 10*3/uL (ref 3.4–10.8)

## 2020-12-19 LAB — CMP14+EGFR
ALT: 20 IU/L (ref 0–44)
AST: 23 IU/L (ref 0–40)
Albumin/Globulin Ratio: 2.3 — ABNORMAL HIGH (ref 1.2–2.2)
Albumin: 4.9 g/dL (ref 4.1–5.2)
Alkaline Phosphatase: 69 IU/L (ref 51–125)
BUN/Creatinine Ratio: 19 (ref 9–20)
BUN: 16 mg/dL (ref 6–20)
Bilirubin Total: 0.3 mg/dL (ref 0.0–1.2)
CO2: 23 mmol/L (ref 20–29)
Calcium: 9.8 mg/dL (ref 8.7–10.2)
Chloride: 102 mmol/L (ref 96–106)
Creatinine, Ser: 0.84 mg/dL (ref 0.76–1.27)
GFR calc Af Amer: 146 mL/min/{1.73_m2} (ref >59)
GFR calc non Af Amer: 126 mL/min/{1.73_m2} (ref >59)
Globulin, Total: 2.1 g/dL (ref 1.5–4.5)
Glucose: 98 mg/dL (ref 65–99)
Potassium: 4.2 mmol/L (ref 3.5–5.2)
Sodium: 142 mmol/L (ref 134–144)
Total Protein: 7 g/dL (ref 6.0–8.5)

## 2020-12-19 LAB — HIV ANTIBODY (ROUTINE TESTING W REFLEX): HIV Screen 4th Generation wRfx: NONREACTIVE

## 2020-12-20 LAB — EBV ACUTE INFECTION ANTIBODIES
EBV Early Antigen Ab, IgG: <9 U/mL (ref 0.0–8.9)
EBV NA IgG: 64 U/mL — ABNORMAL HIGH (ref 0.0–17.9)
EBV VCA IgG: 36.1 U/mL — ABNORMAL HIGH (ref 0.0–17.9)
EBV VCA IgM: <36 U/mL (ref 0.0–35.9)

## 2020-12-20 LAB — MONO, QUAL W/RFLX IF NEGATIVE: Mono Screen: NEGATIVE

## 2020-12-25 ENCOUNTER — Other Ambulatory Visit: Payer: Self-pay

## 2020-12-25 ENCOUNTER — Encounter: Payer: Self-pay | Admitting: Family Medicine

## 2020-12-25 ENCOUNTER — Ambulatory Visit: Payer: 59 | Admitting: Family Medicine

## 2020-12-25 VITALS — BP 140/95 | HR 85 | Temp 98.1°F | Ht 72.0 in | Wt 258.6 lb

## 2020-12-25 DIAGNOSIS — H6123 Impacted cerumen, bilateral: Secondary | ICD-10-CM

## 2020-12-25 NOTE — Progress Notes (Signed)
2/23/202210:19 AM  James Dawson October 08, 2001, 20 y.o., male [id number]  Chief Complaint  Patient presents with  . ear infection     Follow up- now feels clogged and on the left side   . Depression    Positive  PHQ9 =7    HPI:   Patient is a 20 y.o. male with no significant past medical history who presents today for ear pain and irrigation follow up.  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[employer] After bilateral cerumen irrigation last week Completed 7 days of bid Ciprodex Has noticed a lot of ear wax coming out the first few days but now that stopped Pain is much improved  Cannot swallow pills  Depression screen [facility] 2/9 12/25/2020 12/18/2020 07/26/2020  Decreased Interest 0 0 0  Down, Depressed, Hopeless 3 0 0  PHQ - 2 Score 3 0 0  Altered sleeping 1 - -  Tired, decreased energy 1 - -  Change in appetite 0 - -  Feeling bad or failure about yourself  0 - -  Trouble concentrating 2 - -  Moving slowly or fidgety/restless 0 - -  Suicidal thoughts 0 - -  PHQ-9 Score 7 - -  Difficult doing work/chores Very difficult - -    Fall Risk  12/25/2020 12/18/2020 07/26/2020 05/02/2020 02/29/2020  Falls in the past year? 0 0 0 0 0  Number falls in past yr: 0 0 0 - -  Injury with Fall? 0 0 0 - -  Comment - - - - -  Follow up Falls evaluation completed Falls evaluation completed Falls evaluation completed Falls evaluation completed Falls evaluation completed     No Known Allergies  Prior to Admission medications   Not on File    Past Medical History:  Diagnosis Date  . Asthma     Past Surgical History:  Procedure Laterality Date  . DENTAL SURGERY      Social History   Tobacco Use  . Smoking status: Never Smoker  . Smokeless tobacco: Never Used  Substance Use Topics  . Alcohol use: No    Family History  Problem Relation Age of Onset  . Mental illness Sister     Review of Systems  Constitutional: Negative for chills,  fever and malaise/fatigue.  HENT: Negative for congestion, ear pain, hearing loss, sinus pain, sore throat and tinnitus.   Respiratory: Negative for cough, shortness of breath and wheezing.   Cardiovascular: Negative for chest pain, palpitations and leg swelling.  Musculoskeletal: Negative for myalgias.  Skin: Negative for rash.  Neurological: Negative for dizziness, weakness and headaches.     OBJECTIVE:  Today's Vitals   12/25/20 0956  BP: (!) 140/95  Pulse: 85  Temp: 98.1 F (36.7 C)  TempSrc: Temporal  SpO2: 98%  Weight: 258 lb 9.6 oz (117.3 kg)  Height: 6' (1.829 m)   Body mass index is 35.07 kg/m.   Physical Exam Vitals reviewed.  Constitutional:      Appearance: Normal appearance.  HENT:     Head: Normocephalic and atraumatic.     Jaw: No tenderness or pain on movement.     Right Ear: Tympanic membrane, ear canal and external ear normal. There is no impacted cerumen.     Left Ear: Ear canal and external ear normal. There is no impacted cerumen.     Mouth/Throat:     Mouth: Mucous membranes are moist.     Dentition: Normal  dentition.     Pharynx: Oropharynx is clear. No oropharyngeal exudate or posterior oropharyngeal erythema.     Tonsils: No tonsillar exudate or tonsillar abscesses.  Eyes:     Conjunctiva/sclera: Conjunctivae normal.     Pupils: Pupils are equal, round, and reactive to light.  Neck:     Thyroid: No thyroid mass, thyromegaly or thyroid tenderness.  Cardiovascular:     Rate and Rhythm: Normal rate and regular rhythm.     Pulses: Normal pulses.     Heart sounds: Normal heart sounds. No murmur heard. No friction rub. No gallop.   Pulmonary:     Effort: Pulmonary effort is normal. No respiratory distress.     Breath sounds: Normal breath sounds. No stridor. No wheezing or rales.  Abdominal:     General: Bowel sounds are normal.     Palpations: Abdomen is soft.     Tenderness: There is no abdominal tenderness.  Musculoskeletal:      Cervical back: No rigidity. No pain with movement. Normal range of motion.     Right lower leg: No edema.     Left lower leg: No edema.  Lymphadenopathy:     Head:     Right side of head: No submental, submandibular, tonsillar, preauricular, posterior auricular or occipital adenopathy.     Left side of head: No submental, submandibular, tonsillar, preauricular, posterior auricular or occipital adenopathy.     Cervical: No cervical adenopathy.  Skin:    General: Skin is warm and dry.  Neurological:     General: No focal deficit present.     Mental Status: He is alert and oriented to person, place, and time.  Psychiatric:        Mood and Affect: Mood normal.        Behavior: Behavior normal.   Able to visualize TM, large amount cerumen remains present Would like ears lavaged again to get the rest out Canals clear post irrigation  No results found for this or any previous visit (from the past 24 hour(s)).  No results found.   ASSESSMENT and PLAN  Problem List Items Addressed This Visit   None   Visit Diagnoses    Bilateral impacted cerumen    -  Primary   Relevant Orders   Ear wax removal      Plan . Follow up as needed . RTC/ED precautions provided    Return if symptoms worsen or fail to improve.    Macario Carls Purl Claytor, FNP-BC [facility] [address] [place], [place] [address] Ph.  [phone] Fax [phone]

## 2020-12-25 NOTE — Patient Instructions (Signed)

## 2021-02-18 ENCOUNTER — Ambulatory Visit: Payer: 59 | Admitting: Registered Nurse

## 2021-02-18 ENCOUNTER — Other Ambulatory Visit: Payer: Self-pay

## 2021-02-18 VITALS — BP 128/76 | HR 87 | Temp 98.4°F | Resp 15 | Ht 72.0 in | Wt 256.4 lb

## 2021-02-18 DIAGNOSIS — J029 Acute pharyngitis, unspecified: Secondary | ICD-10-CM | POA: Diagnosis not present

## 2021-02-18 DIAGNOSIS — J020 Streptococcal pharyngitis: Secondary | ICD-10-CM | POA: Diagnosis not present

## 2021-02-18 LAB — POCT RAPID STREP A (OFFICE): Rapid Strep A Screen: NEGATIVE

## 2021-02-18 MED ORDER — AMOXICILLIN-POT CLAVULANATE 250-62.5 MG/5ML PO SUSR: 500.0000 mg | Freq: Three times a day (TID) | ORAL | 0 refills | Status: DC

## 2021-02-18 NOTE — Progress Notes (Signed)
Acute Office Visit  Subjective:    Patient ID: James Dawson, male    DOB: 05-Oct-2001, 19 y.o.   MRN: [id number]  Chief Complaint  Patient presents with  . Sore Throat    Pt reports swelling, itchiness, redness, white blotching and some difficulty talking now, started on Friday     HPI Patient is in today for sore throat  Onset Friday to Saturday Now having trouble swallowing, pain, swollen feeling, and feeling of a lot of PND Generally worsening No fevers No nvd No cough or chest congestion  Notes he cannot take pills, no med allergies.  Past Medical History:  Diagnosis Date  . Asthma     Past Surgical History:  Procedure Laterality Date  . DENTAL SURGERY      Family History  Problem Relation Age of Onset  . Mental illness Sister     Social History   Socioeconomic History  . Marital status: Significant Other    Spouse name: Not on file  . Number of children: Not on file  . Years of education: Not on file  . Highest education level: Not on file  Occupational History  . Not on file  Tobacco Use  . Smoking status: Never Smoker  . Smokeless tobacco: Never Used  Substance and Sexual Activity  . Alcohol use: No  . Drug use: No  . Sexual activity: Not on file  Other Topics Concern  . Not on file  Social History Narrative  . Not on file   Social Determinants of Health   Financial Resource Strain: Not on file  Food Insecurity: Not on file  Transportation Needs: Not on file  Physical Activity: Not on file  Stress: Not on file  Social Connections: Not on file  Intimate Partner Violence: Not on file    No outpatient medications prior to visit.   No facility-administered medications prior to visit.    No Known Allergies  Review of Systems  Constitutional: Negative.   HENT: Positive for congestion, sore throat and trouble swallowing.   Eyes: Negative.   Respiratory: Negative.   Cardiovascular: Negative.   Gastrointestinal: Negative.    Genitourinary: Negative.   Musculoskeletal: Negative.   Skin: Negative.   Neurological: Negative.   Psychiatric/Behavioral: Negative.   All other systems reviewed and are negative.      Objective:    Physical Exam Vitals and nursing note reviewed.  Constitutional:      Appearance: Normal appearance.  HENT:     Mouth/Throat:     Tonsils: Tonsillar exudate present. No tonsillar abscesses. 3+ on the right. 3+ on the left.  Cardiovascular:     Rate and Rhythm: Normal rate and regular rhythm.     Pulses: Normal pulses.     Heart sounds: Normal heart sounds. No murmur heard. No friction rub. No gallop.   Pulmonary:     Effort: Pulmonary effort is normal. No respiratory distress.     Breath sounds: Normal breath sounds. No stridor. No wheezing, rhonchi or rales.  Neurological:     General: No focal deficit present.     Mental Status: He is alert. Mental status is at baseline.  Psychiatric:        Mood and Affect: Mood normal.        Behavior: Behavior normal.        Thought Content: Thought content normal.        Judgment: Judgment normal.     BP 128/76   Pulse 87  Temp 98.4 F (36.9 C) (Temporal)   Resp 15   Ht 6' (1.829 m)   Wt 256 lb 6.4 oz (116.3 kg)   SpO2 98%   BMI 34.77 kg/m  Wt Readings from Last 3 Encounters:  02/18/21 256 lb 6.4 oz (116.3 kg)  12/25/20 258 lb 9.6 oz (117.3 kg)  12/18/20 259 lb (117.5 kg)    Health Maintenance Due  Topic Date Due  . Hepatitis C Screening  Never done  . HPV VACCINES (1 - Male 2-dose series) Never done       Topic Date Due  . HPV VACCINES (1 - Male 2-dose series) Never done     Lab Results  Component Value Date   TSH 1.180 03/08/2017   Lab Results  Component Value Date   WBC 7.3 12/18/2020   HGB 15.9 12/18/2020   HCT 44.9 12/18/2020   MCV 83 12/18/2020   PLT 272 12/18/2020   Lab Results  Component Value Date   NA 142 12/18/2020   K 4.2 12/18/2020   CO2 23 12/18/2020   GLUCOSE 98 12/18/2020   BUN 16  12/18/2020   CREATININE 0.84 12/18/2020   BILITOT 0.3 12/18/2020   ALKPHOS 69 12/18/2020   AST 23 12/18/2020   ALT 20 12/18/2020   PROT 7.0 12/18/2020   ALBUMIN 4.9 12/18/2020   CALCIUM 9.8 12/18/2020   No results found for: CHOL No results found for: HDL No results found for: LDLCALC No results found for: TRIG No results found for: CHOLHDL No results found for: INOM7E     Assessment & Plan:   Problem List Items Addressed This Visit   None   Visit Diagnoses    Sore throat    -  Primary   Relevant Orders   POCT rapid strep A (Completed)   Acute streptococcal pharyngitis       Relevant Medications   amoxicillin-clavulanate (AUGMENTIN) 250-62.5 MG/5ML suspension       Meds ordered this encounter  Medications  . amoxicillin-clavulanate (AUGMENTIN) 250-62.5 MG/5ML suspension    Sig: Take 10 mLs (500 mg total) by mouth 3 (three) times daily.    Dispense:  210 mL    Refill:  0    Order Specific Question:   Supervising Provider    Answer:   Neva Seat, JEFFREY R [[id number]]   PLAN  Exudate typical of strep pharyngitis. Will give augmentin liquid po tid for 7 days  ER precautions given 3+ swelling of tonsils  If recurrant will refer to ENT to consider tonsillectomy  Patient encouraged to call clinic with any questions, comments, or concerns.   Janeece Agee, NP

## 2021-02-18 NOTE — Patient Instructions (Addendum)
James Dawson-   Sorry that you're not feeling well. After looking at your throat, I'm fairly certain the strep test was incorrect - your throat looks like the typical strep throat.   I want you to take liquid augmentin - 81mL solution three times daily for seven days. These are usually very large pills. If you can do smaller pills like tylenol, that's okay to use as well. Otherwise, something like a Theraflu or similar syrup can help relieve some symptoms.  If symptoms get worse or fail to improve, please let me know. Rarely, tonsils will swell to the extent that they obstruct swallowing or breathing. This is obviously an emergency situation and warrants evaluation in the ER. I do not think this will happen, but just in case I wanted to let you know.  Thanks  [name] Pharyngitis  Pharyngitis is redness, pain, and swelling (inflammation) of the throat (pharynx). It is a very common cause of sore throat. Pharyngitis can be caused by a bacteria, but it is usually caused by a virus. Most cases of pharyngitis get better on their own without treatment. What are the causes? This condition may be caused by:  Infection by viruses (viral). Viral pharyngitis spreads from person to person (is contagious) through coughing, sneezing, and sharing of personal items or utensils such as cups, forks, spoons, and toothbrushes.  Infection by bacteria (bacterial). Bacterial pharyngitis may be spread by touching the nose or face after coming in contact with the bacteria, or through more intimate contact, such as kissing.  Allergies. Allergies can cause buildup of mucus in the throat (post-nasal drip), leading to inflammation and irritation. Allergies can also cause blocked nasal passages, forcing breathing through the mouth, which dries and irritates the throat. What increases the risk? You are more likely to develop this condition if:  You are 21-11 years old.  You are exposed to crowded environments such as  daycare, school, or dormitory living.  You live in a cold climate.  You have a weakened disease-fighting (immune) system. What are the signs or symptoms? Symptoms of this condition vary by the cause (viral, bacterial, or allergies) and can include:  Sore throat.  Fatigue.  Low-grade fever.  Headache.  Joint pain and muscle aches.  Skin rashes.  Swollen glands in the throat (lymph nodes).  Plaque-like film on the throat or tonsils. This is often a symptom of bacterial pharyngitis.  Vomiting.  Stuffy nose (nasal congestion).  Cough.  Red, itchy eyes (conjunctivitis).  Loss of appetite. How is this diagnosed? This condition is often diagnosed based on your medical history and a physical exam. Your health care provider will ask you questions about your illness and your symptoms. A swab of your throat may be done to check for bacteria (rapid strep test). Other lab tests may also be done, depending on the suspected cause, but these are rare. How is this treated? This condition usually gets better in 3-4 days without medicine. Bacterial pharyngitis may be treated with antibiotic medicines. Follow these instructions at home:  Take over-the-counter and prescription medicines only as told by your health care provider. ? If you were prescribed an antibiotic medicine, take it as told by your health care provider. Do not stop taking the antibiotic even if you start to feel better. ? Do not give children aspirin because of the association with Reye syndrome.  Drink enough water and fluids to keep your urine clear or pale yellow.  Get a lot of rest.  Gargle with a  salt-water mixture 3-4 times a day or as needed. To make a salt-water mixture, completely dissolve -1 tsp of salt in 1 cup of warm water.  If your health care provider approves, you may use throat lozenges or sprays to soothe your throat. Contact a health care provider if:  You have large, tender lumps in your  neck.  You have a rash.  You cough up green, yellow-brown, or bloody spit. Get help right away if:  Your neck becomes stiff.  You drool or are unable to swallow liquids.  You cannot drink or take medicines without vomiting.  You have severe pain that does not go away, even after you take medicine.  You have trouble breathing, and it is not caused by a stuffy nose.  You have new pain and swelling in your joints such as the knees, ankles, wrists, or elbows. Summary  Pharyngitis is redness, pain, and swelling (inflammation) of the throat (pharynx).  While pharyngitis can be caused by a bacteria, the most common causes are viral.  Most cases of pharyngitis get better on their own without treatment.  Bacterial pharyngitis is treated with antibiotic medicines. This information is not intended to replace advice given to you by your health care provider. Make sure you discuss any questions you have with your health care provider. Document Revised: 10/01/2017 Document Reviewed: 11/24/2016 Elsevier Patient Education  2021 [employer]  Strep Throat, Adult Strep throat is an infection in the throat that is caused by bacteria. It is common during the cold months of the year. It mostly affects children who are 21-78 years old. However, people of all ages can get it at any time of the year. This infection spreads from person to person (is contagious) through coughing, sneezing, or having close contact. Your health care provider may use other names to describe the infection. It can be called tonsillitis (if there is swelling of the tonsils), or pharyngitis (if there is swelling at the back of the throat). What are the causes? This condition is caused by the Streptococcus pyogenes bacteria. What increases the risk? You are more likely to develop this condition if:  You care for school-age children, or are around school-age children. Children are more likely to get strep throat and may spread  it to others.  You spend time in crowded places where the infection can spread easily.  You have close contact with someone who has strep throat. What are the signs or symptoms? Symptoms of this condition include:  Fever or chills.  Redness, swelling, or pain in the tonsils or throat.  Pain or difficulty when swallowing.  White or yellow spots on the tonsils or throat.  Tender glands in the neck and under the jaw.  Bad smelling breath.  Red rash all over the body. This is rare. How is this diagnosed? This condition is diagnosed by tests that check for the presence and the amount of bacteria that cause strep throat. They are:  Rapid strep test. Your throat is swabbed and checked for the presence of bacteria. Results are usually ready in minutes.  Throat culture test. Your throat is swabbed. The sample is placed in a cup that allows infections to grow. Results are usually ready in 1 or 2 days.   How is this treated? This condition may be treated with:  Medicines that kill germs (antibiotics).  Medicines that relieve pain or fever. These include: ? Ibuprofen or acetaminophen. ? Aspirin, only for patients who are over the age of  18. ? Throat lozenges. ? Throat sprays. Follow these instructions at home: Medicines  Take over-the-counter and prescription medicines only as told by your health care provider.  Take your antibiotic medicine as told by your health care provider. Do not stop taking the antibiotic even if you start to feel better.   Eating and drinking  If you have trouble swallowing, try eating soft foods until your sore throat feels better.  Drink enough fluid to keep your urine pale yellow.  To help relieve pain, you may have: ? Warm fluids, such as soup and tea. ? Cold fluids, such as frozen desserts or popsicles.   General instructions  Gargle with a salt-water mixture 3-4 times a day or as needed. To make a salt-water mixture, completely dissolve -1 tsp  (3-6 g) of salt in 1 cup (237 mL) of warm water.  Get plenty of rest.  Stay home from work or school until you have been taking antibiotics for 24 hours.  Avoid smoking or being around people who smoke.  Keep all follow-up visits as told by your health care provider. This is important. How is this prevented?  Do not share food, drinking cups, or personal items that could cause the infection to spread to other people.  Wash your hands well with soap and water, and make sure that all people in your house wash their hands well.  Have family members tested if they have a sore throat or fever. They may need an antibiotic if they have strep throat.   Contact a health care provider if:  The glands in your neck continue to get bigger.  You develop a rash, cough, or earache.  You cough up a thick mucus that is green, yellow-brown, or bloody.  You have pain or discomfort that does not get better with medicine.  Your symptoms seem to be getting worse and not better.  You have a fever. Get help right away if:  You have new symptoms, such as vomiting, severe headache, stiff or painful neck, chest pain, or shortness of breath.  You have severe throat pain, drooling, or changes in your voice.  You have swelling of the neck, or the skin on the neck becomes red and tender.  You have signs of dehydration, such as tiredness (fatigue), dry mouth, and decreased urination.  You become increasingly sleepy, or you cannot wake up completely.  Your joints become red or painful. Summary  Strep throat is an infection in the throat that is caused by the Streptococcus pyogenes bacteria. This infection is spread from person to person (is contagious) through coughing, sneezing, or having close contact.  Take your medicines, including antibiotics, as told by your health care provider. Do not stop taking the antibiotic even if you start to feel better.  To prevent the spread of germs, wash your hands  well with soap and water. Have others do the same. Do not share food, drinking cups, or personal items.  Get help right away if you have new symptoms, such as vomiting, severe headache, stiff or painful neck, chest pain, or shortness of breath. This information is not intended to replace advice given to you by your health care provider. Make sure you discuss any questions you have with your health care provider. Document Revised: 01/06/2019 Document Reviewed: 01/06/2019 Elsevier Patient Education  2021 [employer].

## 2021-02-25 ENCOUNTER — Ambulatory Visit: Payer: 59 | Admitting: Registered Nurse

## 2021-07-05 IMAGING — DX LEFT WRIST - 2 VIEW
2 series · 2 of 2 positions shown · non-contrast
Comparison: None.

CLINICAL DATA: Pain following injury

EXAM:
LEFT WRIST - 2 VIEW

[wrist pa]
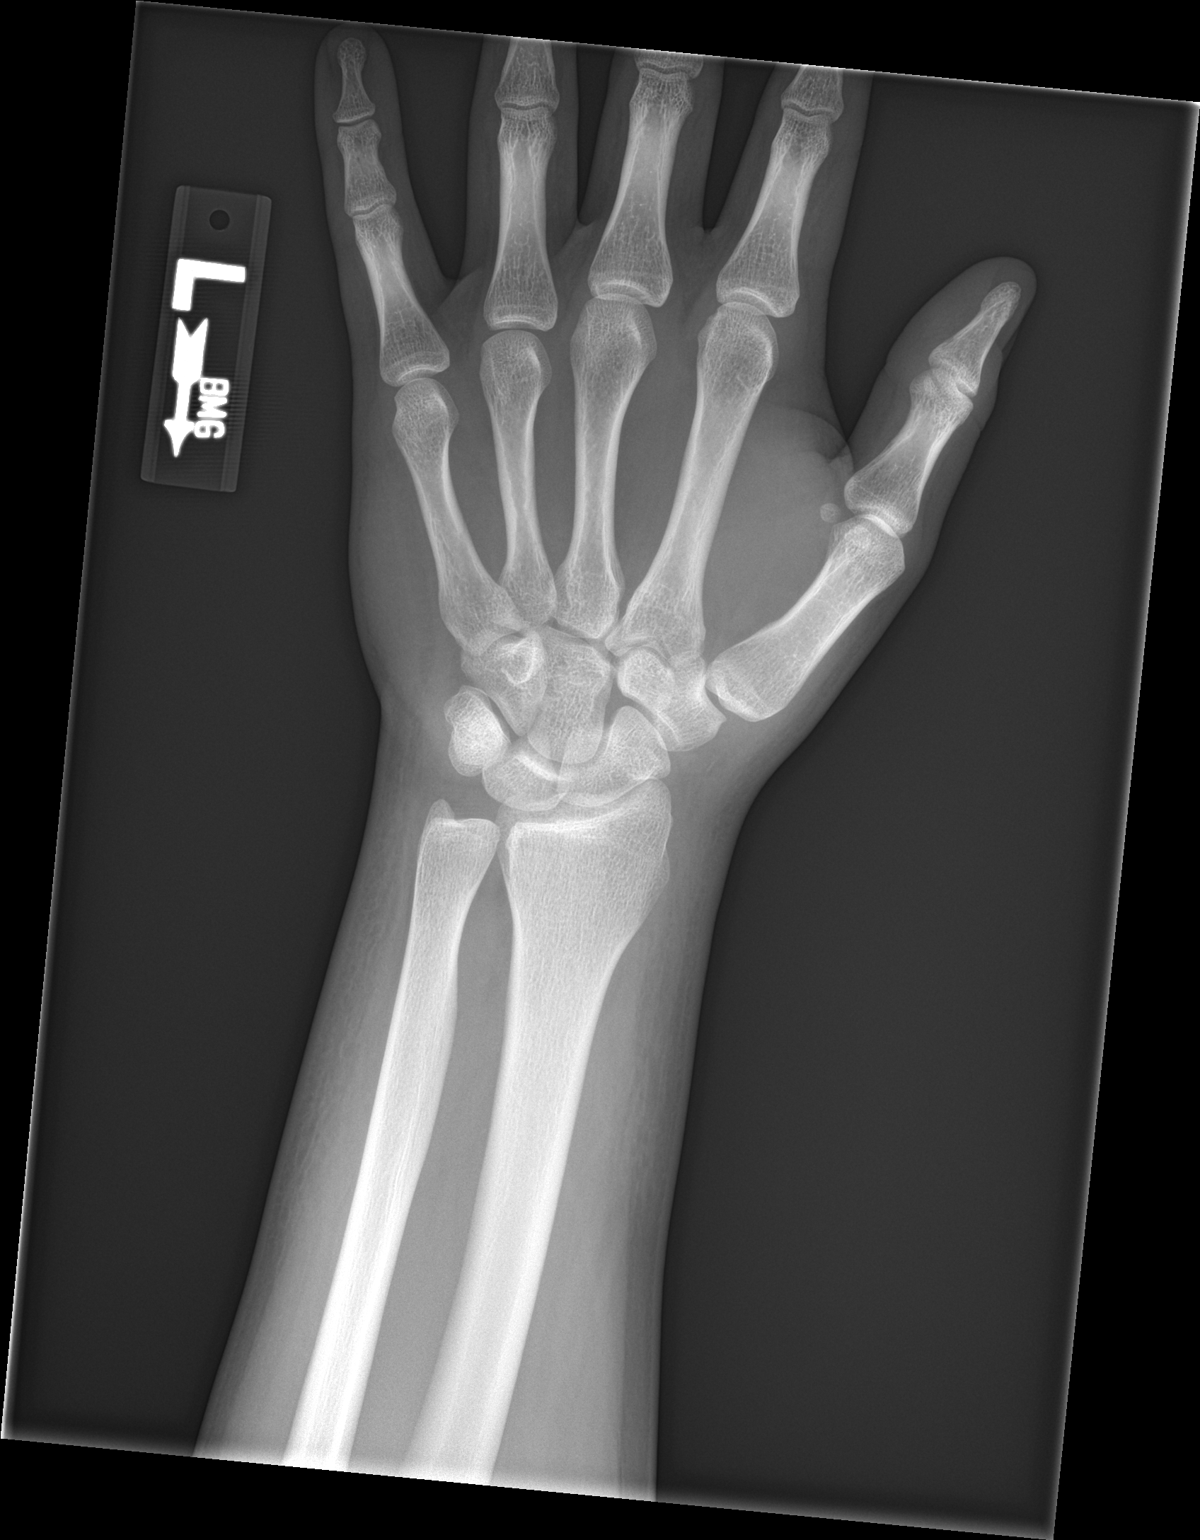

[wrist lat]
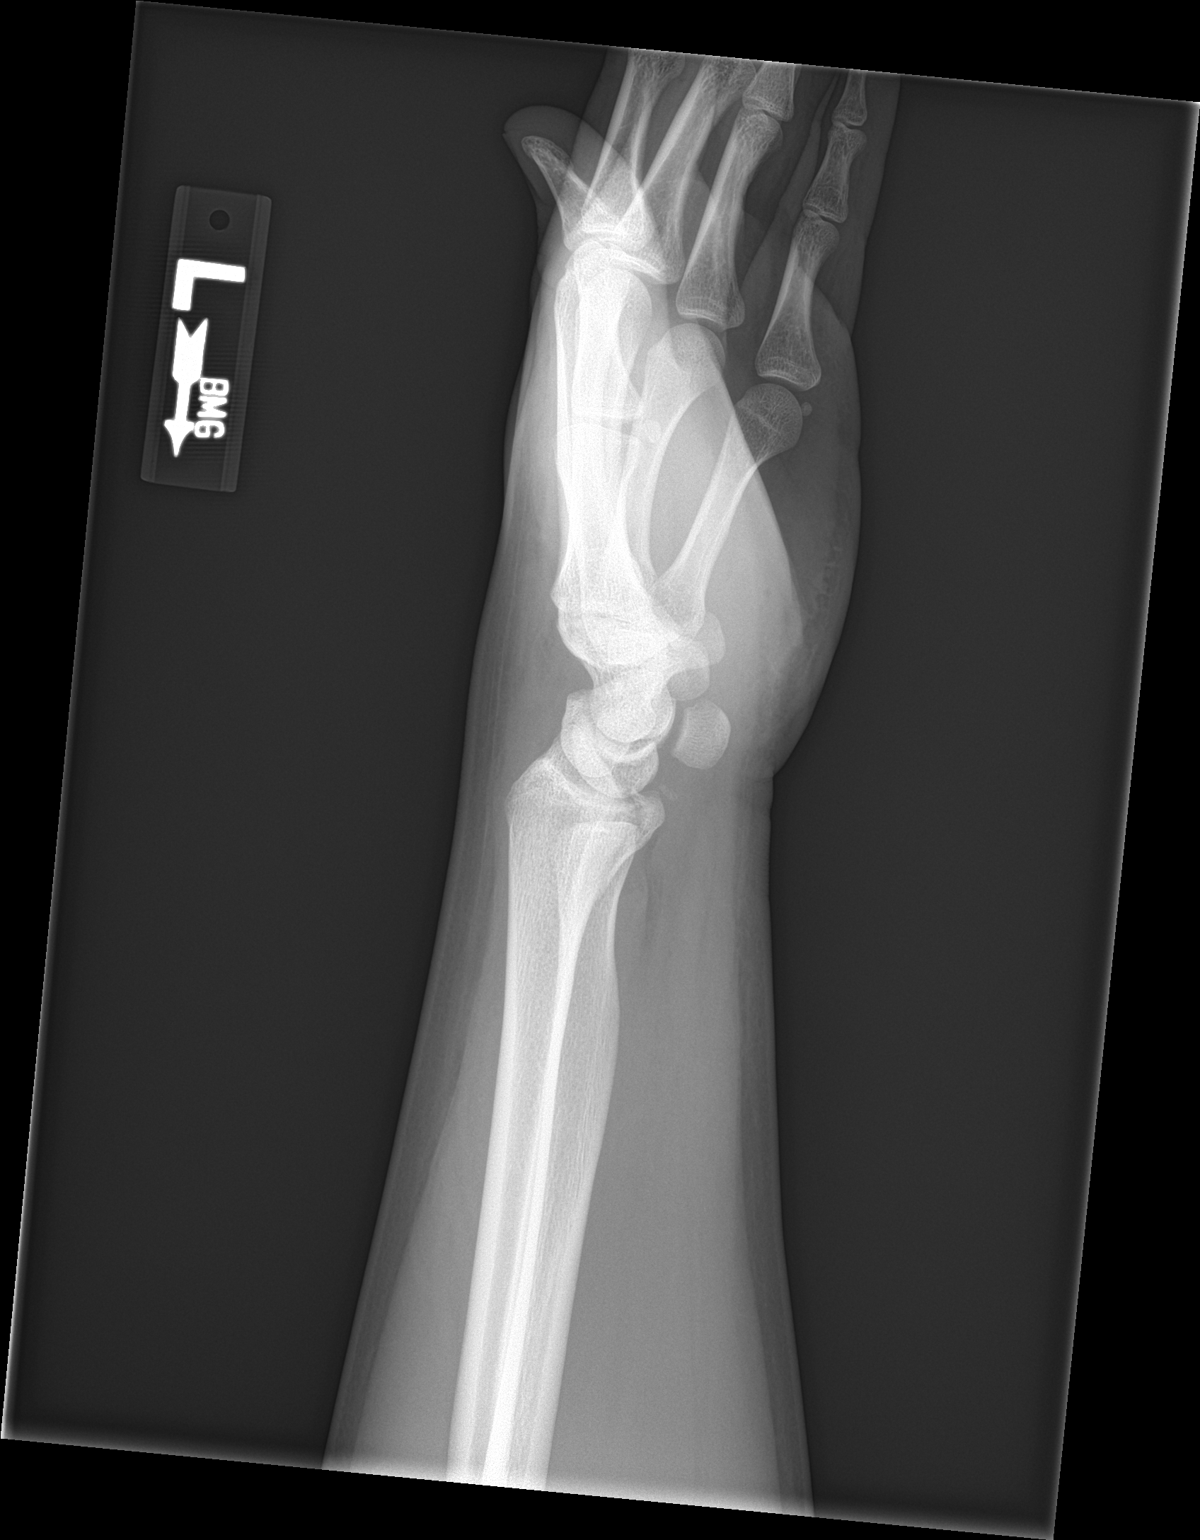

[2 of 2 positions shown; findings below may reference images not displayed]

FINDINGS: Frontal and lateral views were obtained. A small calcification is
noted volar to the distal radius, a finding likely representing age
uncertain avulsion injury. No other fracture. No dislocation. Joint
spaces appear normal. No erosive change.
IMPRESSION: Age uncertain small avulsion arising from volar aspect of the distal
radius. If patient is focally tender in this area, it would be
reasonable to consider treating this area as an acute avulsion
injury. No other evidence suggesting potential fracture. No
dislocation. No appreciable arthropathy.

These results will be called to the ordering clinician or
representative by the Radiologist Assistant, and communication
documented in the PACS or zVision Dashboard.

## 2021-08-20 ENCOUNTER — Other Ambulatory Visit: Payer: Self-pay

## 2021-08-20 ENCOUNTER — Encounter (HOSPITAL_COMMUNITY): Payer: Self-pay | Admitting: Emergency Medicine

## 2021-08-20 ENCOUNTER — Ambulatory Visit (HOSPITAL_COMMUNITY)
Admission: EM | Admit: 2021-08-20 | Discharge: 2021-08-20 | Disposition: A | Discharge status: Home / Self Care | Payer: 59 | Attending: Family Medicine | Admitting: Family Medicine

## 2021-08-20 DIAGNOSIS — S46311A Strain of muscle, fascia and tendon of triceps, right arm, initial encounter: Secondary | ICD-10-CM | POA: Diagnosis not present

## 2021-08-20 MED ORDER — MELOXICAM 15 MG PO TABS: 15.0000 mg | ORAL_TABLET | Freq: Every day | ORAL | 0 refills | Status: DC

## 2021-08-20 NOTE — ED Triage Notes (Signed)
Patient reports working out yesterday for the first time in a year.  Today, the right elbow is very painful, no known injury.  Pain in elbow and posterior, just above elbow

## 2021-08-20 NOTE — ED Provider Notes (Signed)
[facility]    CSN: [id number] Arrival date & time: 08/20/21  1054      History   Chief Complaint Chief Complaint  Patient presents with   Elbow Pain    HPI James Dawson is a 20 y.o. male.   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 Authorizing Provider  meloxicam (MOBIC) 15 MG tablet Take 1 tablet (15 mg total) by mouth daily. 08/20/21  Yes Rufus Cypert, Solmon Ice, DO  amoxicillin-clavulanate (AUGMENTIN) 250-62.5 MG/5ML suspension Take 10 mLs (500 mg total) by mouth 3 (three) times daily. Patient not taking: Reported on 08/20/2021 02/18/21   Janeece Agee, NP    Family History Family History  Adopted: Yes  Problem Relation Age of Onset   Mental illness Sister     Social History Social History   Tobacco Use   Smoking status: Never   Smokeless tobacco: Never  Vaping Use   Vaping Use: Never used  Substance Use Topics   Alcohol use: Yes   Drug use: Yes    Types: Marijuana     Allergies   Patient has no known allergies.   Review of Systems Review of Systems  All other systems reviewed and are negative.  Per HPI Physical Exam Triage Vital Signs ED Triage Vitals  Enc Vitals Group     BP 08/20/21 1142 (!) 143/85     Pulse Rate 08/20/21 1142 93     Resp 08/20/21 1142 20     Temp  08/20/21 1142 98.5 F (36.9 C)     Temp Source 08/20/21 1142 Oral     SpO2 08/20/21 1142 97 %     Weight --      Height --      Head Circumference --      Peak Flow --      Pain Score 08/20/21 1138 6     Pain Loc --      Pain Edu? --      Excl. in GC? --    No data found.  Updated Vital Signs BP (!) 143/85 (BP Location: Left Arm) Comment (BP Location): large cuff  Pulse 93   Temp 98.5 F (36.9 C) (Oral)   Resp 20   SpO2 97%   Visual Acuity Right Eye Distance:   Left Eye Distance:   Bilateral Distance:    Right Eye Near:   Left Eye Near:    Bilateral Near:     Physical Exam Constitutional:      General: He is not in acute distress.    Appearance: Normal appearance. He is not ill-appearing or toxic-appearing.  HENT:     Head: Normocephalic and atraumatic.  Cardiovascular:     Rate and Rhythm: Normal rate.  Pulmonary:  Effort: Pulmonary effort is normal.  Musculoskeletal:     Comments: Right Elbow: - Inspection: no obvious deformity b/l. No swelling, erythema or bruising b/l - Palpation: TTP at triceps insertion at olecranon - ROM: full active ROM in flexion and extension b/l. No crepitus, there is pain with extreme of flexion at the distal triceps tendon - Strength: 5/5 strength in wrist flexion and extension without pain b/l. 5/5 strength in biceps, triceps b/l, there is pain with triceps testing - Neuro: NV intact distally b/l - Special testing: No pain with resisted ECRB or supination   Skin:    General: Skin is warm and dry.  Neurological:     Mental Status: He is alert.     UC Treatments / Results  Labs (all labs ordered are listed, but only abnormal results are displayed) Labs Reviewed - No data to display  EKG   Radiology No results found.  Procedures Procedures (including critical care time)  Medications Ordered in UC Medications - No data to display  Initial Impression / Assessment and Plan / UC Course  I have reviewed the triage  vital signs and the nursing notes.  Pertinent labs & imaging results that were available during my care of the patient were reviewed by me and considered in my medical decision making (see chart for details).     20 year old male who presents with triceps tendon pain following exercise yesterday, tenderness palpated, however is tender.  We will treat as a strain, no indication for imaging at this time.  Given a prescription for meloxicam to use daily for 5 days, then daily as needed.  Advised to avoid other NSAIDs while using.  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  Did discuss rest, ice, topical over-the-counter pain medications as well.  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  Recommended letting pain be his guide and returning to activity over the next 1 to 2 weeks.   Final Clinical Impressions(s) / UC Diagnoses   Final diagnoses:  Strain of right triceps, initial encounter     Discharge Instructions      You have strained your triceps.  You should rest it for the next week or so and let pain be your guide getting back to activity.  You can follow up in the Sports Medicine Clinic at [facility] in 1-2 weeks if you aren't improving.  I have sent a prescription to the pharmacy for meloxicam.  You can take this daily for the next 5 days, then you can take it daily as needed.  Do not take with any ibuprofen, Advil, Aleve, naproxen.  You can also use topical pain gels he can get over-the-counter at the pharmacy.     ED Prescriptions     Medication Sig Dispense Auth. Provider   meloxicam (MOBIC) 15 MG tablet Take 1 tablet (15 mg total) by mouth daily. 30 tablet Tonie Vizcarrondo, Solmon Ice, DO      PDMP not reviewed this encounter.   Unknown Jim, DO 08/20/21 1213

## 2021-08-20 NOTE — Discharge Instructions (Signed)
You have strained your triceps.  You should rest it for the next week or so and let pain be your guide getting back to activity.  You can follow up in the Sports Medicine Clinic at [facility] in 1-2 weeks if you aren't improving.  I have sent a prescription to the pharmacy for meloxicam.  You can take this daily for the next 5 days, then you can take it daily as needed.  Do not take with any ibuprofen, Advil, Aleve, naproxen.  You can also use topical pain gels he can get over-the-counter at the pharmacy.

## 2021-09-13 ENCOUNTER — Ambulatory Visit: Payer: 59

## 2021-09-15 ENCOUNTER — Other Ambulatory Visit: Payer: Self-pay

## 2021-09-15 ENCOUNTER — Ambulatory Visit: Payer: 59 | Admitting: Registered Nurse

## 2021-09-15 ENCOUNTER — Encounter: Payer: Self-pay | Admitting: Registered Nurse

## 2021-09-15 VITALS — BP 134/78 | HR 82 | Temp 98.3°F | Resp 18 | Ht 72.0 in | Wt 256.2 lb

## 2021-09-15 DIAGNOSIS — H6121 Impacted cerumen, right ear: Secondary | ICD-10-CM | POA: Diagnosis not present

## 2021-09-15 DIAGNOSIS — Z23 Encounter for immunization: Secondary | ICD-10-CM

## 2021-09-15 NOTE — Patient Instructions (Addendum)
Mr. James Dawson -   James Dawson to see you  Ear is looking fine after, if you note any symptoms, call me and I'll send drops.   Pop in at some point in 2023 for a physical and labs. Call sooner if you need anything  Thanks,  James Dawson     If you have lab work done today you will be contacted with your lab results within the next 2 weeks.  If you have not heard from [place] then please contact us. The fastest way to get your results is to register for My Chart.   IF you received an x-ray today, you will receive an invoice from [facility]. Please contact [facility] at [phone] with questions or concerns regarding your invoice.   IF you received labwork today, you will receive an invoice from [facility]. Please contact [facility] at [phone] with questions or concerns regarding your invoice.   Our billing staff will not be able to assist you with questions regarding bills from these companies.  You will be contacted with the lab results as soon as they are available. The fastest way to get your results is to activate your My Chart account. Instructions are located on the last page of this paperwork. If you have not heard from [place] regarding the results in 2 weeks, please contact this office.

## 2021-09-15 NOTE — Progress Notes (Signed)
Established Patient Office Visit  Subjective:  Patient ID: James Dawson, male    DOB: 02/23/2001  Age: 20 y.o. MRN: [id number]  CC:  Chief Complaint  Patient presents with   Ear Pain    Patient states he has had a cold and everything went away except te right ear pain he thinks he has an infection for a few days.    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  Some local pain - feels like R side headache.   Past Medical History:  Diagnosis Date   Asthma     Past Surgical History:  Procedure Laterality Date   DENTAL SURGERY      Family History  Adopted: Yes  Problem Relation Age of Onset   Mental illness Sister     Social History   Socioeconomic History   Marital status: Single    Spouse name: Not on file   Number of children: Not on file   Years of education: Not on file   Highest education level: Not on file  Occupational History   Not on file  Tobacco Use   Smoking status: Never   Smokeless tobacco: Never  Vaping Use   Vaping Use: Never used  Substance and Sexual Activity   Alcohol use: Yes   Drug use: Yes    Types: Marijuana   Sexual activity: Not on file  Other Topics Concern   Not on file  Social History Narrative   Not on file   Social Determinants of Health   Financial Resource Strain: Not on file  Food Insecurity: Not on file  Transportation Needs: Not on file  Physical Activity: Not on file  Stress: Not on file  Social Connections: Not on file  Intimate Partner Violence: Not on file    Outpatient Medications Prior to Visit  Medication Sig Dispense Refill   amoxicillin-clavulanate (AUGMENTIN) 250-62.5 MG/5ML suspension Take 10 mLs (500 mg total) by mouth 3 (three) times daily. (Patient not taking: Reported on 08/20/2021) 210 mL 0   meloxicam (MOBIC) 15 MG tablet  Take 1 tablet (15 mg total) by mouth daily. 30 tablet 0   No facility-administered medications prior to visit.    No Known Allergies  ROS Review of Systems  Constitutional: Negative.   HENT: Negative.    Eyes: Negative.   Respiratory: Negative.    Cardiovascular: Negative.   Gastrointestinal: Negative.   Genitourinary: Negative.   Musculoskeletal: Negative.   Skin: Negative.   Neurological: Negative.   Psychiatric/Behavioral: Negative.    All other systems reviewed and are negative.    Objective:    Physical Exam Constitutional:      General: He is not in acute distress.    Appearance: Normal appearance. He is normal weight. He is not ill-appearing, toxic-appearing or diaphoretic.  HENT:     Right Ear: Tympanic membrane, ear canal and external ear normal. There is impacted cerumen.     Left Ear: Tympanic membrane, ear canal and external ear normal. There is no impacted cerumen.  Cardiovascular:     Rate and Rhythm: Normal rate and regular rhythm.     Heart sounds: Normal heart sounds. No murmur heard.   No friction rub. No gallop.  Pulmonary:     Effort: Pulmonary effort is normal. No respiratory distress.  Breath sounds: Normal breath sounds. No stridor. No wheezing, rhonchi or rales.  Chest:     Chest wall: No tenderness.  Lymphadenopathy:     Cervical: No cervical adenopathy.  Neurological:     General: No focal deficit present.     Mental Status: He is alert and oriented to person, place, and time. Mental status is at baseline.  Psychiatric:        Mood and Affect: Mood normal.        Behavior: Behavior normal.        Thought Content: Thought content normal.        Judgment: Judgment normal.    BP 134/78   Pulse 82   Temp 98.3 F (36.8 C) (Temporal)   Resp 18   Ht 6' (1.829 m)   Wt 256 lb 3.2 oz (116.2 kg)   SpO2 99%   BMI 34.75 kg/m  Wt Readings from Last 3 Encounters:  09/15/21 256 lb 3.2 oz (116.2 kg)  02/18/21 256 lb 6.4 oz (116.3 kg)   12/25/20 258 lb 9.6 oz (117.3 kg)     Health Maintenance Due  Topic Date Due   HPV VACCINES (1 - Male 2-dose series) Never done   Hepatitis C Screening  Never done   COVID-19 Vaccine (3 - Booster for Pfizer series) 04/19/2020   INFLUENZA VACCINE  06/02/2021       Topic Date Due   HPV VACCINES (1 - Male 2-dose series) Never done    Lab Results  Component Value Date   TSH 1.180 03/08/2017   Lab Results  Component Value Date   WBC 7.3 12/18/2020   HGB 15.9 12/18/2020   HCT 44.9 12/18/2020   MCV 83 12/18/2020   PLT 272 12/18/2020   Lab Results  Component Value Date   NA 142 12/18/2020   K 4.2 12/18/2020   CO2 23 12/18/2020   GLUCOSE 98 12/18/2020   BUN 16 12/18/2020   CREATININE 0.84 12/18/2020   BILITOT 0.3 12/18/2020   ALKPHOS 69 12/18/2020   AST 23 12/18/2020   ALT 20 12/18/2020   PROT 7.0 12/18/2020   ALBUMIN 4.9 12/18/2020   CALCIUM 9.8 12/18/2020   No results found for: CHOL No results found for: HDL No results found for: LDLCALC No results found for: TRIG No results found for: CHOLHDL No results found for: OEUM3N    Assessment & Plan:   Problem List Items Addressed This Visit   None Visit Diagnoses     Hearing loss of right ear due to cerumen impaction    -  Primary   Flu vaccine need       Relevant Orders   Flu Vaccine QUAD 6+ mos PF IM (Fluarix Quad PF)       No orders of the defined types were placed in this encounter.   Follow-up: Return in about 6 months (around 03/15/2022) for 2023 CPE and labs.James Dawson   PLAN Impacted cerumen r side, resolved with R ear lavage by Nehemiah Settle, CMA. Exam after lavage shows pearly gray eardrum without evidence of otitis. Return prn Patient encouraged to call clinic with any questions, comments, or concerns.  Janeece Agee, NP

## 2022-05-10 ENCOUNTER — Telehealth: Payer: 59 | Admitting: Family Medicine

## 2022-05-10 DIAGNOSIS — U071 COVID-19: Secondary | ICD-10-CM | POA: Diagnosis not present

## 2022-05-10 NOTE — Progress Notes (Signed)
Virtual Visit Consent   James Dawson, you are scheduled for a virtual visit with a [facility] provider today. Just as with appointments in the office, your consent must be obtained to participate. Your consent will be active for this visit and any virtual visit you may have with one of our providers in the next 365 days. If you have a MyChart account, a copy of this consent can be sent to you electronically.  As this is a virtual visit, video technology does not allow for your provider to perform a traditional examination. This may limit your provider's ability to fully assess your condition. If your provider identifies any concerns that need to be evaluated in person or the need to arrange testing (such as labs, EKG, etc.), we will make arrangements to do so. Although advances in technology are sophisticated, we cannot ensure that it will always work on either your end or our end. If the connection with a video visit is poor, the visit may have to be switched to a telephone visit. With either a video or telephone visit, we are not always able to ensure that we have a secure connection.  By engaging in this virtual visit, you consent to the provision of healthcare and authorize for your insurance to be billed (if applicable) for the services provided during this visit. Depending on your insurance coverage, you may receive a charge related to this service.  I need to obtain your verbal consent now. Are you willing to proceed with your visit today? James Dawson has provided verbal consent on 05/10/2022 for a virtual visit (video or telephone). Georgana Curio, FNP  Date: 05/10/2022 3:08 PM  Virtual Visit via Video Note   I, Georgana Curio, connected with  James Dawson  ([id number], 2001-10-10) on 05/10/22 at  3:30 PM EDT by a video-enabled telemedicine application and verified that I am speaking with the correct person using two identifiers.  Location: Patient: Virtual Visit Location Patient:  Home Provider: Virtual Visit Location Provider: Home Office   I discussed the limitations of evaluation and management by telemedicine and the availability of in person appointments. The patient expressed understanding and agreed to proceed.    History of Present Illness: James Dawson is a 21 y.o. who identifies as a male who was assigned male at birth, and is being seen today for positive covid testing today with sx starting Friday night of cough and headache. No loss of taste or smell. No fever. No wheezing or SOB. He requests an OOW note. Marland Kitchen  HPI: HPI  Problems:  Patient Active Problem List   Diagnosis Date Noted   Closed fracture of right distal radius 06/07/2019    Allergies: No Known Allergies Medications:  Current Outpatient Medications:    amoxicillin-clavulanate (AUGMENTIN) 250-62.5 MG/5ML suspension, Take 10 mLs (500 mg total) by mouth 3 (three) times daily. (Patient not taking: Reported on 08/20/2021), Disp: 210 mL, Rfl: 0   meloxicam (MOBIC) 15 MG tablet, Take 1 tablet (15 mg total) by mouth daily., Disp: 30 tablet, Rfl: 0  Observations/Objective: Patient is well-developed, well-nourished in no acute distress.  Resting comfortably  at home.  Head is normocephalic, atraumatic.  No labored breathing.  Speech is clear and coherent with logical content.  Patient is alert and oriented at baseline.    Assessment and Plan: 1. COVID-19  Increase fluids, tylenol or ibuprofen as directed, rest, otc meds to treat sx. Declines cough meds. MVI with VIT D and zinc. Urgent care if sx persist or  worsen.   Follow Up Instructions: I discussed the assessment and treatment plan with the patient. The patient was provided an opportunity to ask questions and all were answered. The patient agreed with the plan and demonstrated an understanding of the instructions.  A copy of instructions were sent to the patient via MyChart unless otherwise noted below.   Patient has requested to receive  PHI (AVS, Work Notes, etc) pertaining to this video visit through e-mail as they are currently without active MyChart. They have voiced understand that email is not considered secure and their health information could be viewed by someone other than the patient.   The patient was advised to call back or seek an in-person evaluation if the symptoms worsen or if the condition fails to improve as anticipated.  Time:  I spent 10 minutes with the patient via telehealth technology discussing the above problems/concerns.    Georgana Curio, FNP

## 2022-05-10 NOTE — Patient Instructions (Signed)
COVID-19: What to Do if You Are Sick If you test positive and are an older adult or someone who is at high risk of getting very sick from COVID-19, treatment may be available. Contact a healthcare provider right away after a positive test to determine if you are eligible, even if your symptoms are mild right now. You can also visit a [facility] location and, if eligible, receive a prescription from a provider. Don't delay: Treatment must be started within the first few days to be effective. If you have a fever, cough, or other symptoms, you might have COVID-19. Most people have mild illness and are able to recover at home. If you are sick: Keep track of your symptoms. If you have an emergency warning sign (including trouble breathing), call 911. Steps to help prevent the spread of COVID-19 if you are sick If you are sick with COVID-19 or think you might have COVID-19, follow the steps below to care for yourself and to help protect other people in your home and community. Stay home except to get medical care Stay home. Most people with COVID-19 have mild illness and can recover at home without medical care. Do not leave your home, except to get medical care. Do not visit public areas and do not go to places where you are unable to wear a mask. Take care of yourself. Get rest and stay hydrated. Take over-the-counter medicines, such as acetaminophen, to help you feel better. Stay in touch with your doctor. Call before you get medical care. Be sure to get care if you have trouble breathing, or have any other emergency warning signs, or if you think it is an emergency. Avoid public transportation, ride-sharing, or taxis if possible. Get tested If you have symptoms of COVID-19, get tested. While waiting for test results, stay away from others, including staying apart from those living in your household. Get tested as soon as possible after your symptoms start. Treatments may be available for people with  COVID-19 who are at risk for becoming very sick. Don't delay: Treatment must be started early to be effective--some treatments must begin within 5 days of your first symptoms. Contact your healthcare provider right away if your test result is positive to determine if you are eligible. Self-tests are one of several options for testing for the virus that causes COVID-19 and may be more convenient than laboratory-based tests and point-of-care tests. Ask your healthcare provider or your local health department if you need help interpreting your test results. You can visit your state, tribal, local, and territorial health department's website to look for the latest local information on testing sites. Separate yourself from other people As much as possible, stay in a specific room and away from other people and pets in your home. If possible, you should use a separate bathroom. If you need to be around other people or animals in or outside of the home, wear a well-fitting mask. Tell your close contacts that they may have been exposed to COVID-19. An infected person can spread COVID-19 starting 48 hours (or 2 days) before the person has any symptoms or tests positive. By letting your close contacts know they may have been exposed to COVID-19, you are helping to protect everyone. See COVID-19 and Animals if you have questions about pets. If you are diagnosed with COVID-19, someone from the health department may call you. Answer the call to slow the spread. Monitor your symptoms Symptoms of COVID-19 include fever, cough, or other   symptoms. Follow care instructions from your healthcare provider and local health department. Your local health authorities may give instructions on checking your symptoms and reporting information. When to seek emergency medical attention Look for emergency warning signs* for COVID-19. If someone is showing any of these signs, seek emergency medical care immediately: Trouble  breathing Persistent pain or pressure in the chest New confusion Inability to wake or stay awake Pale, gray, or blue-colored skin, lips, or nail beds, depending on skin tone *This list is not all possible symptoms. Please call your medical provider for any other symptoms that are severe or concerning to you. Call 911 or call ahead to your local emergency facility: Notify the operator that you are seeking care for someone who has or may have COVID-19. Call ahead before visiting your doctor Call ahead. Many medical visits for routine care are being postponed or done by phone or telemedicine. If you have a medical appointment that cannot be postponed, call your doctor's office, and tell them you have or may have COVID-19. This will help the office protect themselves and other patients. If you are sick, wear a well-fitting mask You should wear a mask if you must be around other people or animals, including pets (even at home). Wear a mask with the best fit, protection, and comfort for you. You don't need to wear the mask if you are alone. If you can't put on a mask (because of trouble breathing, for example), cover your coughs and sneezes in some other way. Try to stay at least 6 feet away from other people. This will help protect the people around you. Masks should not be placed on young children under age 2 years, anyone who has trouble breathing, or anyone who is not able to remove the mask without help. Cover your coughs and sneezes Cover your mouth and nose with a tissue when you cough or sneeze. Throw away used tissues in a lined trash can. Immediately wash your hands with soap and water for at least 20 seconds. If soap and water are not available, clean your hands with an alcohol-based hand sanitizer that contains at least 60% alcohol. Clean your hands often Wash your hands often with soap and water for at least 20 seconds. This is especially important after blowing your nose, coughing, or  sneezing; going to the bathroom; and before eating or preparing food. Use hand sanitizer if soap and water are not available. Use an alcohol-based hand sanitizer with at least 60% alcohol, covering all surfaces of your hands and rubbing them together until they feel dry. Soap and water are the best option, especially if hands are visibly dirty. Avoid touching your eyes, nose, and mouth with unwashed hands. Handwashing Tips Avoid sharing personal household items Do not share dishes, drinking glasses, cups, eating utensils, towels, or bedding with other people in your home. Wash these items thoroughly after using them with soap and water or put in the dishwasher. Clean surfaces in your home regularly Clean and disinfect high-touch surfaces (for example, doorknobs, tables, handles, light switches, and countertops) in your "sick room" and bathroom. In shared spaces, you should clean and disinfect surfaces and items after each use by the person who is ill. If you are sick and cannot clean, a caregiver or other person should only clean and disinfect the area around you (such as your bedroom and bathroom) on an as needed basis. Your caregiver/other person should wait as long as possible (at least several hours) and wear a   mask before entering, cleaning, and disinfecting shared spaces that you use. Clean and disinfect areas that may have blood, stool, or body fluids on them. Use household cleaners and disinfectants. Clean visible dirty surfaces with household cleaners containing soap or detergent. Then, use a household disinfectant. Use a product from [employer]: Disinfectants for Coronavirus ([id number]). Be sure to follow the instructions on the label to ensure safe and effective use of the product. Many products recommend keeping the surface wet with a disinfectant for a certain period of time (look at "contact time" on the product label). You may also need to wear personal protective equipment, such as  gloves, depending on the directions on the product label. Immediately after disinfecting, wash your hands with soap and water for 20 seconds. For completed guidance on cleaning and disinfecting your home, visit Complete Disinfection Guidance. Take steps to improve ventilation at home Improve ventilation (air flow) at home to help prevent from spreading COVID-19 to other people in your household. Clear out COVID-19 virus particles in the air by opening windows, using air filters, and turning on fans in your home. Use this interactive tool to learn how to improve air flow in your home. When you can be around others after being sick with COVID-19 Deciding when you can be around others is different for different situations. Find out when you can safely end home isolation. For any additional questions about your care, contact your healthcare provider or state or local health department. 01/21/2021 Content source: [facility] for Immunization and Respiratory Diseases ([facility]), Division of Viral Diseases This information is not intended to replace advice given to you by your health care provider. Make sure you discuss any questions you have with your health care provider. Document Revised: 07/11/2021 Document Reviewed: 07/11/2021 Elsevier Patient Education  2022 [place]: [employer] and Audiological scientist If you were exposed Quarantine and stay away from others when you have been in close contact with someone who has COVID-19. Isolate If you are sick or test positive Isolate when you are sick or when you have COVID-19, even if you don't have symptoms. When to stay home Calculating quarantine The date of your exposure is considered day 0. Day 1 is the first full day after your last contact with a person who has had COVID-19. Stay home and away from other people for at least 5 days. Learn why CDC updated guidance for the general public. IF YOU were exposed to COVID-19 and are NOT   up to dateIF YOU were exposed to COVID-19 and are NOT on COVID-19 vaccinations Quarantine for at least 5 days Stay home Stay home and quarantine for at least 5 full days. Wear a well-fitting mask if you must be around others in your home. Do not travel. Get tested Even if you don't develop symptoms, get tested at least 5 days after you last had close contact with someone with COVID-19. After quarantine Watch for symptoms Watch for symptoms until 10 days after you last had close contact with someone with COVID-19. Avoid travel It is best to avoid travel until a full 10 days after you last had close contact with someone with COVID-19. If you develop symptoms Isolate immediately and get tested. Continue to stay home until you know the results. Wear a well-fitting mask around others. Take precautions until day 10 Wear a well-fitting mask Wear a well-fitting mask for 10 full days any time you are around others inside your home or in public. Do not go  to places where you are unable to wear a well-fitting mask. If you must travel during days 6-10, take precautions. Avoid being around people who are more likely to get very sick from COVID-19. IF YOU were exposed to COVID-19 and are  up to dateIF YOU were exposed to COVID-19 and are on COVID-19 vaccinations No quarantine You do not need to stay home unless you develop symptoms. Get tested Even if you don't develop symptoms, get tested at least 5 days after you last had close contact with someone with COVID-19. Watch for symptoms Watch for symptoms until 10 days after you last had close contact with someone with COVID-19. If you develop symptoms Isolate immediately and get tested. Continue to stay home until you know the results. Wear a well-fitting mask around others. Take precautions until day 10 Wear a well-fitting mask Wear a well-fitting mask for 10 full days any time you are around others inside your home or in public. Do not go to  places where you are unable to wear a well-fitting mask. Take precautions if traveling Avoid being around people who are more likely to get very sick from COVID-19. IF YOU were exposed to COVID-19 and had confirmed COVID-19 within the past 90 days (you tested positive using a viral test) No quarantine You do not need to stay home unless you develop symptoms. Watch for symptoms Watch for symptoms until 10 days after you last had close contact with someone with COVID-19. If you develop symptoms Isolate immediately and get tested. Continue to stay home until you know the results. Wear a well-fitting mask around others. Take precautions until day 10 Wear a well-fitting mask Wear a well-fitting mask for 10 full days any time you are around others inside your home or in public. Do not go to places where you are unable to wear a well-fitting mask. Take precautions if traveling Avoid being around people who are more likely to get very sick from COVID-19. Calculating isolation Day 0 is your first day of symptoms or a positive viral test. Day 1 is the first full day after your symptoms developed or your test specimen was collected. If you have COVID-19 or have symptoms, isolate for at least 5 days. IF YOU tested positive for COVID-19 or have symptoms, regardless of vaccination status Stay home for at least 5 days Stay home for 5 days and isolate from others in your home. Wear a well-fitting mask if you must be around others in your home. Do not travel. Ending isolation if you had symptoms End isolation after 5 full days if you are fever-free for 24 hours (without the use of fever-reducing medication) and your symptoms are improving. Ending isolation if you did NOT have symptoms End isolation after at least 5 full days after your positive test. If you got very sick from COVID-19 or have a weakened immune system You should isolate for at least 10 days. Consult your doctor before ending  isolation. Take precautions until day 10 Wear a well-fitting mask Wear a well-fitting mask for 10 full days any time you are around others inside your home or in public. Do not go to places where you are unable to wear a well-fitting mask. Do not travel Do not travel until a full 10 days after your symptoms started or the date your positive test was taken if you had no symptoms. Avoid being around people who are more likely to get very sick from COVID-19. Definitions Exposure Contact with someone infected with  SARS-CoV-2, the virus that causes COVID-19, in a way that increases the likelihood of getting infected with the virus. Close contact A close contact is someone who was less than 6 feet away from an infected person (laboratory-confirmed or a clinical diagnosis) for a cumulative total of 15 minutes or more over a 24-hour period. For example, three individual 5-minute exposures for a total of 15 minutes. People who are exposed to someone with COVID-19 after they completed at least 5 days of isolation are not considered close contacts. Rachel Bo is a strategy used to prevent transmission of COVID-19 by keeping people who have been in close contact with someone with COVID-19 apart from others. Who does not need to quarantine? If you had close contact with someone with COVID-19 and you are in one of the following groups, you do not need to quarantine. You are up to date with your COVID-19 vaccines. You had confirmed COVID-19 within the last 90 days (meaning you tested positive using a viral test). If you are up to date with COVID-19 vaccines, you should wear a well-fitting mask around others for 10 days from the date of your last close contact with someone with COVID-19 (the date of last close contact is considered day 0). Get tested at least 5 days after you last had close contact with someone with COVID-19. If you test positive or develop COVID-19 symptoms, isolate from other people  and follow recommendations in the Isolation section below. If you tested positive for COVID-19 with a viral test within the previous 90 days and subsequently recovered and remain without COVID-19 symptoms, you do not need to quarantine or get tested after close contact. You should wear a well-fitting mask around others for 10 days from the date of your last close contact with someone with COVID-19 (the date of last close contact is considered day 0). If you have COVID-19 symptoms, get tested and isolate from other people and follow recommendations in the Isolation section below. Who should quarantine? If you come into close contact with someone with COVID-19, you should quarantine if you are not up to date on COVID-19 vaccines. This includes people who are not vaccinated. What to do for quarantine Stay home and away from other people for at least 5 days (day 0 through day 5) after your last contact with a person who has COVID-19. The date of your exposure is considered day 0. Wear a well-fitting mask when around others at home, if possible. For 10 days after your last close contact with someone with COVID-19, watch for fever (100.34F or greater), cough, shortness of breath, or other COVID-19 symptoms. If you develop symptoms, get tested immediately and isolate until you receive your test results. If you test positive, follow isolation recommendations. If you do not develop symptoms, get tested at least 5 days after you last had close contact with someone with COVID-19. If you test negative, you can leave your home, but continue to wear a well-fitting mask when around others at home and in public until 10 days after your last close contact with someone with COVID-19. If you test positive, you should isolate for at least 5 days from the date of your positive test (if you do not have symptoms). If you do develop COVID-19 symptoms, isolate for at least 5 days from the date your symptoms began (the date the  symptoms started is day 0). Follow recommendations in the isolation section below. If you are unable to get a test 5 days after  last close contact with someone with COVID-19, you can leave your home after day 5 if you have been without COVID-19 symptoms throughout the 5-day period. Wear a well-fitting mask for 10 days after your date of last close contact when around others at home and in public. Avoid people who are have weakened immune systems or are more likely to get very sick from COVID-19, and nursing homes and other high-risk settings, until after at least 10 days. If possible, stay away from people you live with, especially people who are at higher risk for getting very sick from COVID-19, as well as others outside your home throughout the full 10 days after your last close contact with someone with COVID-19. If you are unable to quarantine, you should wear a well-fitting mask for 10 days when around others at home and in public. If you are unable to wear a mask when around others, you should continue to quarantine for 10 days. Avoid people who have weakened immune systems or are more likely to get very sick from COVID-19, and nursing homes and other high-risk settings, until after at least 10 days. See additional information about travel. Do not go to places where you are unable to wear a mask, such as restaurants and some gyms, and avoid eating around others at home and at work until after 10 days after your last close contact with someone with COVID-19. After quarantine Watch for symptoms until 10 days after your last close contact with someone with COVID-19. If you have symptoms, isolate immediately and get tested. Quarantine in high-risk congregate settings In certain congregate settings that have high risk of secondary transmission (such as Patent examiner and detention facilities, homeless shelters, or cruise ships), CDC recommends a 10-day quarantine for residents, regardless of vaccination  and booster status. During periods of critical staffing shortages, facilities may consider shortening the quarantine period for staff to ensure continuity of operations. Decisions to shorten quarantine in these settings should be made in consultation with state, local, tribal, or territorial health departments and should take into consideration the context and characteristics of the facility. CDC's setting-specific guidance provides additional recommendations for these settings. Isolation Isolation is used to separate people with confirmed or suspected COVID-19 from those without COVID-19. People who are in isolation should stay home until it's safe for them to be around others. At home, anyone sick or infected should separate from others, or wear a well-fitting mask when they need to be around others. People in isolation should stay in a specific "sick room" or area and use a separate bathroom if available. Everyone who has presumed or confirmed COVID-19 should stay home and isolate from other people for at least 5 full days (day 0 is the first day of symptoms or the date of the day of the positive viral test for asymptomatic persons). They should wear a mask when around others at home and in public for an additional 5 days. People who are confirmed to have COVID-19 or are showing symptoms of COVID-19 need to isolate regardless of their vaccination status. This includes: People who have a positive viral test for COVID-19, regardless of whether or not they have symptoms. People with symptoms of COVID-19, including people who are awaiting test results or have not been tested. People with symptoms should isolate even if they do not know if they have been in close contact with someone with COVID-19. What to do for isolation Monitor your symptoms. If you have an emergency warning sign (including trouble  breathing), seek emergency medical care immediately. Stay in a separate room from other household members, if  possible. Use a separate bathroom, if possible. Take steps to improve ventilation at home, if possible. Avoid contact with other members of the household and pets. Don't share personal household items, like cups, towels, and utensils. Wear a well-fitting mask when you need to be around other people. Learn more about what to do if you are sick and how to notify your contacts. Ending isolation for people who had COVID-19 and had symptoms If you had COVID-19 and had symptoms, isolate for at least 5 days. To calculate your 5-day isolation period, day 0 is your first day of symptoms. Day 1 is the first full day after your symptoms developed. You can leave isolation after 5 full days. You can end isolation after 5 full days if you are fever-free for 24 hours without the use of fever-reducing medication and your other symptoms have improved (Loss of taste and smell may persist for weeks or months after recovery and need not delay the end of isolation). You should continue to wear a well-fitting mask around others at home and in public for 5 additional days (day 6 through day 10) after the end of your 5-day isolation period. If you are unable to wear a mask when around others, you should continue to isolate for a full 10 days. Avoid people who have weakened immune systems or are more likely to get very sick from COVID-19, and nursing homes and other high-risk settings, until after at least 10 days. If you continue to have fever or your other symptoms have not improved after 5 days of isolation, you should wait to end your isolation until you are fever-free for 24 hours without the use of fever-reducing medication and your other symptoms have improved. Continue to wear a well-fitting mask through day 10. Contact your healthcare provider if you have questions. See additional information about travel. Do not go to places where you are unable to wear a mask, such as restaurants and some gyms, and avoid eating  around others at home and at work until a full 10 days after your first day of symptoms. If an individual has access to a test and wants to test, the best approach is to use an antigen test1 towards the end of the 5-day isolation period. Collect the test sample only if you are fever-free for 24 hours without the use of fever-reducing medication and your other symptoms have improved (loss of taste and smell may persist for weeks or months after recovery and need not delay the end of isolation). If your test result is positive, you should continue to isolate until day 10. If your test result is negative, you can end isolation, but continue to wear a well-fitting mask around others at home and in public until day 10. Follow additional recommendations for masking and avoiding travel as described above. 1As noted in the labeling for authorized over-the counter antigen tests: Negative results should be treated as presumptive. Negative results do not rule out SARS-CoV-2 infection and should not be used as the sole basis for treatment or patient management decisions, including infection control decisions. To improve results, antigen tests should be used twice over a three-day period with at least 24 hours and no more than 48 hours between tests. Note that these recommendations on ending isolation do not apply to people who are moderately ill or very sick from COVID-19 or have weakened immune systems. See section  below for recommendations for when to end isolation for these groups. Ending isolation for people who tested positive for COVID-19 but had no symptoms If you test positive for COVID-19 and never develop symptoms, isolate for at least 5 days. Day 0 is the day of your positive viral test (based on the date you were tested) and day 1 is the first full day after the specimen was collected for your positive test. You can leave isolation after 5 full days. If you continue to have no symptoms, you can end isolation  after at least 5 days. You should continue to wear a well-fitting mask around others at home and in public until day 10 (day 6 through day 10). If you are unable to wear a mask when around others, you should continue to isolate for 10 days. Avoid people who have weakened immune systems or are more likely to get very sick from COVID-19, and nursing homes and other high-risk settings, until after at least 10 days. If you develop symptoms after testing positive, your 5-day isolation period should start over. Day 0 is your first day of symptoms. Follow the recommendations above for ending isolation for people who had COVID-19 and had symptoms. See additional information about travel. Do not go to places where you are unable to wear a mask, such as restaurants and some gyms, and avoid eating around others at home and at work until 10 days after the day of your positive test. If an individual has access to a test and wants to test, the best approach is to use an antigen test1 towards the end of the 5-day isolation period. If your test result is positive, you should continue to isolate until day 10. If your test result is positive, you can also choose to test daily and if your test result is negative, you can end isolation, but continue to wear a well-fitting mask around others at home and in public until day 10. Follow additional recommendations for masking and avoiding travel as described above. 1As noted in the labeling for authorized over-the counter antigen tests: Negative results should be treated as presumptive. Negative results do not rule out SARS-CoV-2 infection and should not be used as the sole basis for treatment or patient management decisions, including infection control decisions. To improve results, antigen tests should be used twice over a three-day period with at least 24 hours and no more than 48 hours between tests. Ending isolation for people who were moderately or very sick from COVID-19 or  have a weakened immune system People who are moderately ill from COVID-19 (experiencing symptoms that affect the lungs like shortness of breath or difficulty breathing) should isolate for 10 days and follow all other isolation precautions. To calculate your 10-day isolation period, day 0 is your first day of symptoms. Day 1 is the first full day after your symptoms developed. If you are unsure if your symptoms are moderate, talk to a healthcare provider for further guidance. People who are very sick from COVID-19 (this means people who were hospitalized or required intensive care or ventilation support) and people who have weakened immune systems might need to isolate at home longer. They may also require testing with a viral test to determine when they can be around others. CDC recommends an isolation period of at least 10 and up to 20 days for people who were very sick from COVID-19 and for people with weakened immune systems. Consult with your healthcare provider about when you can resume  being around other people. If you are unsure if your symptoms are severe or if you have a weakened immune system, talk to a healthcare provider for further guidance. People who have a weakened immune system should talk to their healthcare provider about the potential for reduced immune responses to COVID-19 vaccines and the need to continue to follow current prevention measures (including wearing a well-fitting mask and avoiding crowds and poorly ventilated indoor spaces) to protect themselves against COVID-19 until advised otherwise by their healthcare provider. Close contacts of immunocompromised people--including household members--should also be encouraged to receive all recommended COVID-19 vaccine doses to help protect these people. Isolation in high-risk congregate settings In certain high-risk congregate settings that have high risk of secondary transmission and where it is not feasible to cohort people (such as  Systems analyst and detention facilities, homeless shelters, and cruise ships), CDC recommends a 10-day isolation period for residents. During periods of critical staffing shortages, facilities may consider shortening the isolation period for staff to ensure continuity of operations. Decisions to shorten isolation in these settings should be made in consultation with state, local, tribal, or territorial health departments and should take into consideration the context and characteristics of the facility. CDC's setting-specific guidance provides additional recommendations for these settings. This CDC guidance is meant to supplement--not replace--any federal, state, local, territorial, or tribal health and safety laws, rules, and regulations. Recommendations for specific settings These recommendations do not apply to healthcare professionals. For guidance specific to these settings, see Healthcare professionals: Interim Guidance for Optician, dispensing with SARS-CoV-2 Infection or Exposure to SARS-CoV-2 Patients, residents, and visitors to healthcare settings: Interim Infection Prevention and Control Recommendations for Healthcare Personnel During the [place] 2019 (COVID-19) Pandemic Additional setting-specific guidance and recommendations are available. These recommendations on quarantine and isolation do apply to [place] settings. Additional guidance is available here: Overview of COVID-19 Quarantine for K-12 Schools Travelers: Travel information and recommendations Congregate facilities and other settings: [employer] for community, work, and school settings Ongoing COVID-19 exposure FAQs I live with someone with COVID-19, but I cannot be separated from them. How do we manage quarantine in this situation? It is very important for people with COVID-19 to remain apart from other people, if possible, even if they are living together. If separation of the person with COVID-19 from  others that they live with is not possible, the other people that they live with will have ongoing exposure, meaning they will be repeatedly exposed until that person is no longer able to spread the virus to other people. In this situation, there are precautions you can take to limit the spread of COVID-19: The person with COVID-19 and everyone they live with should wear a well-fitting mask inside the home. If possible, one person should care for the person with COVID-19 to limit the number of people who are in close contact with the infected person. Take steps to protect yourself and others to reduce transmission in the home: Quarantine if you are not up to date with your COVID-19 vaccines. Isolate if you are sick or tested positive for COVID-19, even if you don't have symptoms. Learn more about the public health recommendations for testing, mask use and quarantine of close contacts, like yourself, who have ongoing exposure. These recommendations differ depending on your vaccination status. What should I do if I have ongoing exposure to COVID-19 from someone I live with? Recommendations for this situation depend on your vaccination status: If you are not up to date on  COVID-19 vaccines and have ongoing exposure to COVID-19, you should: Begin quarantine immediately and continue to quarantine throughout the isolation period of the person with COVID-19. Continue to quarantine for an additional 5 days starting the day after the end of isolation for the person with COVID-19. Get tested at least 5 days after the end of isolation of the infected person that lives with them. If you test negative, you can leave the home but should continue to wear a well-fitting mask when around others at home and in public until 10 days after the end of isolation for the person with COVID-19. Isolate immediately if you develop symptoms of COVID-19 or test positive. If you are up to date with COVID-19 vaccines and have  ongoing exposure to COVID-19, you should: Get tested at least 5 days after your first exposure. A person with COVID-19 is considered infectious starting 2 days before they develop symptoms, or 2 days before the date of their positive test if they do not have symptoms. Get tested again at least 5 days after the end of isolation for the person with COVID-19. Wear a well-fitting mask when you are around the person with COVID-19, and do this throughout their isolation period. Wear a well-fitting mask around others for 10 days after the infected person's isolation period ends. Isolate immediately if you develop symptoms of COVID-19 or test positive. What should I do if multiple people I live with test positive for COVID-19 at different times? Recommendations for this situation depend on your vaccination status: If you are not up to date with your COVID-19 vaccines, you should: Quarantine throughout the isolation period of any infected person that you live with. Continue to quarantine until 5 days after the end of isolation date for the most recently infected person that lives with you. For example, if the last day of isolation of the person most recently infected with COVID-19 was June 30, the new 5-day quarantine period starts on July 1. Get tested at least 5 days after the end of isolation for the most recently infected person that lives with you. Wear a well-fitting mask when you are around any person with COVID-19 while that person is in isolation. Wear a well-fitting mask when you are around other people until 10 days after your last close contact. Isolate immediately if you develop symptoms of COVID-19 or test positive. If you are up to date with your COVID-19 vaccines, you should: Get tested at least 5 days after your first exposure. A person with COVID-19 is considered infectious starting 2 days before they developed symptoms, or 2 days before the date of their positive test if they do not have  symptoms. Get tested again at least 5 days after the end of isolation for the most recently infected person that lives with you. Wear a well-fitting mask when you are around any person with COVID-19 while that person is in isolation. Wear a well-fitting mask around others for 10 days after the end of isolation for the most recently infected person that lives with you. For example, if the last day of isolation for the person most recently infected with COVID-19 was June 30, the new 10-day period to wear a well-fitting mask indoors in public starts on July 1. Isolate immediately if you develop symptoms of COVID-19 or test positive. I had COVID-19 and completed isolation. Do I have to quarantine or get tested if someone I live with gets COVID-19 shortly after I completed isolation? No. If you recently completed  isolation and someone that lives with you tests positive for the virus that causes COVID-19 shortly after the end of your isolation period, you do not have to quarantine or get tested as long as you do not develop new symptoms. Once all of the people that live together have completed isolation or quarantine, refer to the guidance below for new exposures to COVID-19. If you had COVID-19 in the previous 90 days and then came into close contact with someone with COVID-19, you do not have to quarantine or get tested if you do not have symptoms. But you should: Wear a well-fitting mask indoors in public for 10 days after your last close contact. Monitor for COVID-19 symptoms for 10 days from the date of your last close contact. Isolate immediately and get tested if symptoms develop. If more than 90 days have passed since your recovery from infection, follow CDC's recommendations for close contacts. These recommendations will differ depending on your vaccination status. 01/29/2021 Content source: [facility] ([facility]), Division of Viral Diseases This  information is not intended to replace advice given to you by your health care provider. Make sure you discuss any questions you have with your health care provider. Document Revised: 06/04/2021 Document Reviewed: 06/04/2021 Elsevier Patient Education  [place].

## 2023-04-05 ENCOUNTER — Encounter: Payer: Self-pay | Admitting: Family Medicine

## 2023-04-05 ENCOUNTER — Ambulatory Visit: Payer: 59 | Admitting: Family Medicine

## 2023-04-05 VITALS — BP 130/84 | HR 70 | Temp 98.2°F | Ht 71.0 in | Wt 275.6 lb

## 2023-04-05 DIAGNOSIS — Z0000 Encounter for general adult medical examination without abnormal findings: Secondary | ICD-10-CM | POA: Diagnosis not present

## 2023-04-05 DIAGNOSIS — Z1159 Encounter for screening for other viral diseases: Secondary | ICD-10-CM

## 2023-04-05 DIAGNOSIS — F32A Depression, unspecified: Secondary | ICD-10-CM | POA: Insufficient documentation

## 2023-04-05 DIAGNOSIS — F330 Major depressive disorder, recurrent, mild: Secondary | ICD-10-CM | POA: Diagnosis not present

## 2023-04-05 LAB — CBC WITH DIFFERENTIAL/PLATELET
Basophils Absolute: 0.1 10*3/uL (ref 0.0–0.1)
Basophils Relative: 0.9 % (ref 0.0–3.0)
Eosinophils Absolute: 0.2 10*3/uL (ref 0.0–0.7)
Eosinophils Relative: 1.5 % (ref 0.0–5.0)
HCT: 43.8 % (ref 39.0–52.0)
Hemoglobin: 14.7 g/dL (ref 13.0–17.0)
Lymphocytes Relative: 24.7 % (ref 12.0–46.0)
Lymphs Abs: 2.6 10*3/uL (ref 0.7–4.0)
MCHC: 33.7 g/dL (ref 30.0–36.0)
MCV: 85 fl (ref 78.0–100.0)
Monocytes Absolute: 0.6 10*3/uL (ref 0.1–1.0)
Monocytes Relative: 6.1 % (ref 3.0–12.0)
Neutro Abs: 7 10*3/uL (ref 1.4–7.7)
Neutrophils Relative %: 66.8 % (ref 43.0–77.0)
Platelets: 276 10*3/uL (ref 150.0–400.0)
RBC: 5.15 Mil/uL (ref 4.22–5.81)
RDW: 13.5 % (ref 11.5–15.5)
WBC: 10.5 10*3/uL (ref 4.0–10.5)

## 2023-04-05 LAB — COMPREHENSIVE METABOLIC PANEL
ALT: 20 U/L (ref 0–53)
AST: 26 U/L (ref 0–37)
Albumin: 4.7 g/dL (ref 3.5–5.2)
Alkaline Phosphatase: 56 U/L (ref 39–117)
BUN: 12 mg/dL (ref 6–23)
CO2: 26 mEq/L (ref 19–32)
Calcium: 9.7 mg/dL (ref 8.4–10.5)
Chloride: 102 mEq/L (ref 96–112)
Creatinine, Ser: 0.93 mg/dL (ref 0.40–1.50)
GFR: 116.24 mL/min (ref >60.00)
Glucose, Bld: 84 mg/dL (ref 70–99)
Potassium: 3.9 mEq/L (ref 3.5–5.1)
Sodium: 139 mEq/L (ref 135–145)
Total Bilirubin: 0.5 mg/dL (ref 0.2–1.2)
Total Protein: 7.5 g/dL (ref 6.0–8.3)

## 2023-04-05 NOTE — Progress Notes (Signed)
Complete physical exam  Patient: James Dawson   DOB: 10-02-2001   21 y.o. Male  MRN: [id number]  Subjective:    Chief Complaint  Patient presents with   Establish Care    James Dawson is a 22 y.o. male who presents today for a complete physical exam. He reports consuming a general diet. The patient does not participate in regular exercise at present. 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 He reports sleeping well. He does not have additional problems to discuss today.    Most recent fall risk assessment:    09/15/2021   12:44 PM  Fall Risk   Falls in the past year? 0  Number falls in past yr: 0  Injury with Fall? 0  Risk for fall due to : No Fall Risks  Follow up Falls evaluation completed     Most recent depression screenings:    09/15/2021   12:44 PM 12/25/2020    9:54 AM  PHQ 2/9 Scores  PHQ - 2 Score 3 3  PHQ- 9 Score 8 7    Dental: No current dental problems and Receives regular dental care    Patient Care Team: Karie Georges, MD as PCP - General (Family Medicine)   Outpatient Medications Prior to Visit  Medication Sig   [DISCONTINUED] amoxicillin-clavulanate (AUGMENTIN) 250-62.5 MG/5ML suspension Take 10 mLs (500 mg total) by mouth 3 (three) times daily. (Patient not taking: Reported on 08/20/2021)   [DISCONTINUED] meloxicam (MOBIC) 15 MG tablet Take 1 tablet (15 mg total) by mouth daily.   No facility-administered medications prior to visit.    Review of Systems  HENT:  Negative for hearing loss.   Eyes:  Negative for blurred vision.  Respiratory:  Negative for shortness of breath.   Cardiovascular:  Negative for chest pain.  Gastrointestinal: Negative.   Genitourinary: Negative.   Musculoskeletal:  Negative for back pain.  Neurological:  Negative for headaches.  Psychiatric/Behavioral:  Negative for depression.   All other systems reviewed and are negative.      Objective:     BP 130/84 (BP  Location: Left Arm, Patient Position: Sitting, Cuff Size: Large)   Pulse 70   Temp 98.2 F (36.8 C) (Oral)   Ht 5\' 11"  (1.803 m)   Wt 275 lb 9.6 oz (125 kg)   SpO2 95%   BMI 38.44 kg/m    Physical Exam Vitals reviewed.  Constitutional:      Appearance: Normal appearance. He is well-groomed and normal weight.  Eyes:     Extraocular Movements: Extraocular movements intact.     Conjunctiva/sclera: Conjunctivae normal.  Neck:     Thyroid: No thyromegaly.  Cardiovascular:     Rate and Rhythm: Normal rate and regular rhythm.     Heart sounds: S1 normal and S2 normal. No murmur heard. Pulmonary:     Effort: Pulmonary effort is normal.     Breath sounds: Normal breath sounds and air entry. No rales.  Abdominal:     General: Abdomen is flat. Bowel sounds are normal.  Musculoskeletal:     Right lower leg: No edema.     Left lower leg: No edema.  Neurological:     General: No focal deficit present.     Mental Status: He is alert and oriented to person, place, and time.     Gait: Gait is intact.  Psychiatric:        Mood and Affect: Mood and affect normal.  No results found for any visits on 04/05/23.     Assessment & Plan:    Routine Health Maintenance and Physical Exam  Immunization History  Administered Date(s) Administered   Influenza,inj,Quad PF,6+ Mos 09/05/2015, 10/29/2017, 12/08/2018, 09/15/2021   PFIZER Comirnaty(Gray Top)Covid-19 Tri-Sucrose Vaccine 02/01/2020, 02/23/2020   Tdap 06/01/2019    Health Maintenance  Topic Date Due   HPV VACCINES (1 - Male 2-dose series) Never done   Hepatitis C Screening  Never done   COVID-19 Vaccine (3 - 2023-24 season) 04/21/2023 (Originally 07/03/2022)   INFLUENZA VACCINE  06/03/2023   DTaP/Tdap/Td (2 - Td or Tdap) 05/31/2029   HIV Screening  Completed    Discussed health benefits of physical activity, and encouraged him to engage in regular exercise appropriate for his age and condition.  Preventative health care -      Comprehensive metabolic panel -     CBC with Differential/Platelet  Need for hepatitis C screening test -     Hepatitis C antibody  Mild episode of recurrent major depressive disorder (HCC)  Patient scored a 12 on the PHQ and 11 on the GAD, he is going to therapy to help with this. We discussed possible use of medication, however he wants to try therapy first. Uses [employer] [facility] on his phone. He will let me know if he would like to try medication at a later date.  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 Handout given on healthy eating and exercise.   No follow-ups on file.     Karie Georges, MD

## 2023-04-06 LAB — HEPATITIS C ANTIBODY: Hepatitis C Ab: NONREACTIVE

## 2023-09-13 ENCOUNTER — Ambulatory Visit: Payer: 59 | Admitting: Family Medicine

## 2023-09-13 ENCOUNTER — Encounter: Payer: Self-pay | Admitting: Family Medicine

## 2023-09-13 VITALS — BP 112/80 | HR 76 | Temp 98.2°F | Ht 71.0 in | Wt 283.2 lb

## 2023-09-13 DIAGNOSIS — M25532 Pain in left wrist: Secondary | ICD-10-CM | POA: Diagnosis not present

## 2023-09-13 DIAGNOSIS — Z23 Encounter for immunization: Secondary | ICD-10-CM

## 2023-09-13 NOTE — Patient Instructions (Signed)
Ibuprofen 800 mg every 8 as needed  Heating pads  immobilization

## 2023-09-13 NOTE — Progress Notes (Signed)
   Acute Office Visit  Subjective:     Patient ID: James Dawson, male    DOB: 28-May-2001, 22 y.o.   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 States that when he "rolls" his wrist he will get a popping sound and something feels like it is out of place. When he is playing a show it ill start to ache and his muscles in his forearm will tense.   Review of Systems  All other systems reviewed and are negative.       Objective:    BP 112/80 (BP Location: Left Arm, Patient Position: Sitting, Cuff Size: Large)   Pulse 76   Temp 98.2 F (36.8 C) (Oral)   Ht 5\' 11"  (1.803 m)   Wt 283 lb 3.2 oz (128.5 kg)   SpO2 98%   BMI 39.50 kg/m    Physical Exam Vitals reviewed.  Constitutional:      Appearance: Normal appearance. He is obese.  Pulmonary:     Effort: Pulmonary effort is normal.  Musculoskeletal:        General: No swelling or deformity.  Neurological:     Mental Status: He is alert and oriented to person, place, and time. Mental status is at baseline.     No results found for any visits on 09/13/23.      Assessment & Plan:   Problem List Items Addressed This Visit   None Visit Diagnoses     Left wrist pain    -  Primary   Relevant Orders   DG Wrist Complete Left     Most likely a tendinopathy due to repetitive motions. Will check x-ray to rule out any bony abnormalities, recommended getting a wrist splint and continuing NSAIDS OTC for hte pain  No orders of the defined types were placed in this encounter.   No follow-ups on file.  Karie Georges, MD

## 2023-09-17 ENCOUNTER — Other Ambulatory Visit: Payer: 59

## 2023-09-17 ENCOUNTER — Ambulatory Visit (INDEPENDENT_AMBULATORY_CARE_PROVIDER_SITE_OTHER): Payer: 59

## 2023-09-17 DIAGNOSIS — M25532 Pain in left wrist: Secondary | ICD-10-CM

## 2024-04-27 ENCOUNTER — Encounter: Payer: Self-pay | Admitting: Family Medicine

## 2024-04-27 ENCOUNTER — Ambulatory Visit: Admitting: Family Medicine

## 2024-04-27 ENCOUNTER — Ambulatory Visit

## 2024-04-27 VITALS — BP 102/80 | HR 85 | Temp 98.0°F | Ht 71.0 in | Wt 291.0 lb

## 2024-04-27 DIAGNOSIS — M25572 Pain in left ankle and joints of left foot: Secondary | ICD-10-CM

## 2024-04-27 LAB — URIC ACID: Uric Acid, Serum: 10.3 mg/dL — ABNORMAL HIGH (ref 4.0–7.8)

## 2024-04-27 NOTE — Progress Notes (Signed)
   Acute Office Visit  Subjective:     Patient ID: James Dawson, male    DOB: [birth year], 23 y.o.   MRN: [id number]  Chief Complaint  Patient presents with   Ankle Pain    Patient complains of left ankle tightness x1 month, difficulty with ROM, no known injury   Toe Pain    Patient complains of left 5th toe pain suddenly x1 week, noticed when lying in bed while stretching ankle, no known injury    Pt reports left ankle tightness for about 1 month states that he notices it more at night. States that he feels more stiff his in ankle. No redness, some 5th toe pain x 1 week, states the pain shoots down to the tip of his fifth but only with certain movements. No pain with weight bearing, notices it more at night when he is laying down, but can feel it during the day. States it is when he tries to invert his ankle. States that his left leg tends to swell more so than the right, especially if he has been on his feet all day. States he mainly wears Crocs while at work, then also wears Vans otherwise.      Review of Systems  All other systems reviewed and are negative.       Objective:    BP 102/80   Pulse 85   Temp 98 F (36.7 C) (Oral)   Ht 5' 11 (1.803 m)   Wt 291 lb (132 kg)   SpO2 98%   BMI 40.59 kg/m    Physical Exam Vitals reviewed.  Constitutional:      Appearance: Normal appearance. He is morbidly obese.   Musculoskeletal:     Left ankle: No swelling or deformity. Tenderness (base of the fifth metatarsal TTP) present. Decreased range of motion. Normal pulse.     Left Achilles Tendon: Normal.   Neurological:     Mental Status: He is alert.     No results found for any visits on 04/27/24.      Assessment & Plan:   Problem List Items Addressed This Visit   None Visit Diagnoses       Acute left ankle pain    -  Primary   Relevant Orders   Uric Acid   Rheumatoid factor   ANA w/Reflex   DG Ankle Complete Left     Will order labs to rule out  inflammatory arthritis, however his physical exam findings are not suggesting this. Will get x-rays to rule out bony abnormalities. I gave him handouts on ankle exercises to start doing and recommended OTC ibuprofen PRN and to stop wearing crocs, use supportive shoes.   No orders of the defined types were placed in this encounter.   No follow-ups on file.  Heron CHRISTELLA Sharper, MD

## 2024-04-27 NOTE — Patient Instructions (Signed)
 Ibuprofen 600-800 mg every 8 hours as needed for ankle/foot pain

## 2024-04-28 LAB — RHEUMATOID FACTOR: Rheumatoid fact SerPl-aCnc: <10 [IU]/mL (ref <14)

## 2024-04-28 LAB — ANA W/REFLEX: Anti Nuclear Antibody (ANA): NEGATIVE

## 2024-05-01 ENCOUNTER — Ambulatory Visit: Payer: Self-pay | Admitting: Family Medicine

## 2024-07-11 ENCOUNTER — Encounter: Payer: Self-pay | Admitting: Family Medicine

## 2024-07-11 ENCOUNTER — Ambulatory Visit: Admitting: Family Medicine

## 2024-07-11 VITALS — BP 120/86 | HR 81 | Temp 97.9°F | Ht 71.0 in | Wt 289.0 lb

## 2024-07-11 DIAGNOSIS — M109 Gout, unspecified: Secondary | ICD-10-CM | POA: Diagnosis not present

## 2024-07-11 MED ORDER — ALLOPURINOL 100 MG PO TABS: 100.0000 mg | ORAL_TABLET | Freq: Every day | ORAL | 6 refills | Status: DC

## 2024-07-11 MED ORDER — COLCHICINE 0.6 MG PO TABS: ORAL_TABLET | ORAL | 1 refills | Status: AC

## 2024-07-11 NOTE — Progress Notes (Signed)
 Established Patient Office Visit  Subjective   Patient ID: James Dawson, male    DOB: 04/06/2001  Age: 23 y.o. MRN: [id number]  Chief Complaint  Patient presents with   Pain    Patient complains of sudden onset of left plantar foot pain x4 days, no known injury, tried Ibuprofen with some relief    HPI  Discussed the use of AI scribe software for clinical note transcription with the patient, who gave verbal consent to proceed.  History of Present Illness   James Dawson is a 23 year old male who presents with left foot pain.  He began experiencing pain in his left foot on Friday evening after waking from a nap. Initially, the pain was tender and caused him to walk awkwardly. By Saturday, the pain intensified, making it difficult to bear weight, and he required the use of a cane. The pain is localized to the outer side of the left foot and persists.  He denies any specific injury or trauma to the foot and has not engaged in activities such as running or heavy lifting that might have contributed to the pain. Swelling was noted on Saturday or Sunday, but there was no discoloration or skin changes. The foot was tender to touch, especially when bearing weight, and he had difficulty wiggling his toes when the pain was at its worst.  In June, he experienced a similar issue with his left ankle, during which his uric acid level was measured at 10.3. At that time, he was advised to take ibuprofen and follow a low purine diet, which he has only partially adhered to.      Current Outpatient Medications  Medication Instructions   allopurinol  (ZYLOPRIM ) 100 mg, Oral, Daily   colchicine  0.6 MG tablet Day 1: Take 2 tablets, then 1 tablet an hour later. Day 2 and beyond: 1 tab daily.    Patient Active Problem List   Diagnosis Date Noted   Depression 04/05/2023   Closed fracture of right distal radius 06/07/2019     Review of Systems  All other systems reviewed and are negative.      Objective:     BP 120/86   Pulse 81   Temp 97.9 F (36.6 C) (Oral)   Ht 5' 11 (1.803 m)   Wt 289 lb (131.1 kg)   SpO2 98%   BMI 40.31 kg/m    Physical Exam Vitals reviewed.  Constitutional:      Appearance: Normal appearance. He is obese.  Musculoskeletal:     Right lower leg: No edema.     Left lower leg: No edema.     Right foot: Normal.     Left foot: Tenderness (5th metatarsal joint) present. No swelling or deformity.  Neurological:     Mental Status: He is alert and oriented to person, place, and time. Mental status is at baseline.  Psychiatric:        Mood and Affect: Mood normal.        Behavior: Behavior normal.      No results found for any visits on 07/11/24.    The ASCVD Risk score (Arnett DK, et al., 2019) failed to calculate for the following reasons:   The 2019 ASCVD risk score is only valid for ages 59 to 4    Assessment & Plan:  Gout, arthropathy -     Allopurinol ; Take 1 tablet (100 mg total) by mouth daily.  Dispense: 30 tablet; Refill: 6 -     Uric  acid; Future -     Colchicine ; Day 1: Take 2 tablets, then 1 tablet an hour later. Day 2 and beyond: 1 tab daily.  Dispense: 30 tablet; Refill: 1   Assessment and Plan    Acute gout flare, left foot with hyperuricemia Acute gout flare in left foot with hyperuricemia. Symptoms include tenderness, swelling, and pain. Previous x-ray showed no arthritis or joint erosion. High uric acid levels support gout diagnosis. - Continue low purine diet. - Use ibuprofen for pain management.  -- this is likely his second flare up in 3 months, will start allopurinol  once daily and give him PRN colchicine  at home to use for acute flare up. Handouts given.       No follow-ups on file.    Heron CHRISTELLA Sharper, MD

## 2024-07-13 ENCOUNTER — Ambulatory Visit: Admitting: Family Medicine

## 2024-08-10 ENCOUNTER — Other Ambulatory Visit (INDEPENDENT_AMBULATORY_CARE_PROVIDER_SITE_OTHER)

## 2024-08-10 ENCOUNTER — Ambulatory Visit: Payer: Self-pay | Admitting: Family Medicine

## 2024-08-10 DIAGNOSIS — M109 Gout, unspecified: Secondary | ICD-10-CM

## 2024-08-10 LAB — URIC ACID: Uric Acid, Serum: 8.2 mg/dL — ABNORMAL HIGH (ref 4.0–7.8)

## 2024-08-11 MED ORDER — ALLOPURINOL 300 MG PO TABS: 300.0000 mg | ORAL_TABLET | Freq: Every day | ORAL | 6 refills | Status: AC

## 2024-08-16 ENCOUNTER — Ambulatory Visit (INDEPENDENT_AMBULATORY_CARE_PROVIDER_SITE_OTHER): Admitting: Family Medicine

## 2024-08-16 ENCOUNTER — Encounter: Payer: Self-pay | Admitting: Family Medicine

## 2024-08-16 VITALS — BP 118/80 | HR 85 | Temp 98.2°F | Ht 71.25 in | Wt 280.6 lb

## 2024-08-16 DIAGNOSIS — Z0000 Encounter for general adult medical examination without abnormal findings: Secondary | ICD-10-CM | POA: Diagnosis not present

## 2024-08-16 DIAGNOSIS — Z13220 Encounter for screening for lipoid disorders: Secondary | ICD-10-CM

## 2024-08-16 DIAGNOSIS — Z23 Encounter for immunization: Secondary | ICD-10-CM

## 2024-08-16 DIAGNOSIS — M109 Gout, unspecified: Secondary | ICD-10-CM | POA: Diagnosis not present

## 2024-08-16 LAB — CBC WITH DIFFERENTIAL/PLATELET
Basophils Absolute: 0.1 K/uL (ref 0.0–0.1)
Basophils Relative: 0.7 % (ref 0.0–3.0)
Eosinophils Absolute: 0.2 K/uL (ref 0.0–0.7)
Eosinophils Relative: 1.8 % (ref 0.0–5.0)
HCT: 44.7 % (ref 39.0–52.0)
Hemoglobin: 15.4 g/dL (ref 13.0–17.0)
Lymphocytes Relative: 26 % (ref 12.0–46.0)
Lymphs Abs: 2.6 K/uL (ref 0.7–4.0)
MCHC: 34.5 g/dL (ref 30.0–36.0)
MCV: 84.3 fl (ref 78.0–100.0)
Monocytes Absolute: 0.6 K/uL (ref 0.1–1.0)
Monocytes Relative: 5.6 % (ref 3.0–12.0)
Neutro Abs: 6.7 K/uL (ref 1.4–7.7)
Neutrophils Relative %: 65.9 % (ref 43.0–77.0)
Platelets: 259 K/uL (ref 150.0–400.0)
RBC: 5.31 Mil/uL (ref 4.22–5.81)
RDW: 13.3 % (ref 11.5–15.5)
WBC: 10.1 K/uL (ref 4.0–10.5)

## 2024-08-16 LAB — COMPREHENSIVE METABOLIC PANEL WITH GFR
ALT: 24 U/L (ref 0–53)
AST: 29 U/L (ref 0–37)
Albumin: 4.8 g/dL (ref 3.5–5.2)
Alkaline Phosphatase: 46 U/L (ref 39–117)
BUN: 14 mg/dL (ref 6–23)
CO2: 27 meq/L (ref 19–32)
Calcium: 9.6 mg/dL (ref 8.4–10.5)
Chloride: 103 meq/L (ref 96–112)
Creatinine, Ser: 0.87 mg/dL (ref 0.40–1.50)
GFR: 120.98 mL/min (ref >60.00)
Glucose, Bld: 93 mg/dL (ref 70–99)
Potassium: 4.1 meq/L (ref 3.5–5.1)
Sodium: 139 meq/L (ref 135–145)
Total Bilirubin: 0.6 mg/dL (ref 0.2–1.2)
Total Protein: 7.3 g/dL (ref 6.0–8.3)

## 2024-08-16 LAB — LIPID PANEL
Cholesterol: 189 mg/dL (ref 0–200)
HDL: 31.9 mg/dL — ABNORMAL LOW (ref >39.00)
LDL Cholesterol: 92 mg/dL (ref 0–99)
NonHDL: 157.2
Total CHOL/HDL Ratio: 6
Triglycerides: 327 mg/dL — ABNORMAL HIGH (ref 0.0–149.0)
VLDL: 65.4 mg/dL — ABNORMAL HIGH (ref 0.0–40.0)

## 2024-08-16 LAB — URIC ACID: Uric Acid, Serum: 8.6 mg/dL — ABNORMAL HIGH (ref 4.0–7.8)

## 2024-08-16 NOTE — Progress Notes (Signed)
 Complete physical exam  Patient: James Dawson   DOB: 01-28-2001   23 y.o. Male  MRN: [id number]  Subjective:    Chief Complaint  Patient presents with   Annual Exam    James Dawson is a 23 y.o. male who presents today for a complete physical exam. He reports consuming a general diet. Pt does eat fast food about twice a week, eats a lot of chicken, rarely eats breakfast, doesn't eat a lot of dairy. Home exercise routine includes walking 1-2 hrs per week. 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 He generally feels well. He reports sleeping fairly well. He does not have additional problems to discuss today.    Most recent fall risk assessment:    09/15/2021   12:44 PM  Fall Risk   Falls in the past year? 0  Number falls in past yr: 0  Injury with Fall? 0  Risk for fall due to : No Fall Risks  Follow up Falls evaluation completed      Data saved with a previous flowsheet row definition     Most recent depression screenings:    08/16/2024   10:59 AM 04/27/2024    1:30 PM  PHQ 2/9 Scores  PHQ - 2 Score 4 3  PHQ- 9 Score 11 8    Vision:Within last year and [place], wears glasses  and Dental: No current dental problems and he sometimes misses a 6 month cleaning  Patient Active Problem List   Diagnosis Date Noted   Depression 04/05/2023   Closed fracture of right distal radius 06/07/2019      Patient Care Team: Ozell James HERO, MD as PCP - General (Family Medicine)   Outpatient Medications Prior to Visit  Medication Sig   allopurinol  (ZYLOPRIM ) 300 MG tablet Take 1 tablet (300 mg total) by mouth daily.   colchicine  0.6 MG tablet Day 1: Take 2 tablets, then 1 tablet an hour later. Day 2 and beyond: 1 tab daily.   No facility-administered medications prior to visit.    Review of Systems  HENT:  Negative for hearing loss.   Eyes:  Negative for blurred vision.  Respiratory:  Negative for shortness of breath.   Cardiovascular:  Negative  for chest pain.  Gastrointestinal: Negative.   Genitourinary: Negative.   Musculoskeletal:  Negative for back pain.  Neurological:  Negative for headaches.  Psychiatric/Behavioral:  Negative for depression.        Objective:     BP 118/80   Pulse 85   Temp 98.2 F (36.8 C) (Oral)   Ht 5' 11.25 (1.81 m)   Wt 280 lb 9.6 oz (127.3 kg)   SpO2 95%   BMI 38.86 kg/m    Physical Exam Vitals reviewed.  Constitutional:      Appearance: Normal appearance. He is well-groomed. He is obese.  HENT:     Right Ear: Tympanic membrane and ear canal normal.     Left Ear: Tympanic membrane and ear canal normal.     Mouth/Throat:     Mouth: Mucous membranes are moist.     Pharynx: No posterior oropharyngeal erythema.  Eyes:     Extraocular Movements: Extraocular movements intact.     Conjunctiva/sclera: Conjunctivae normal.  Neck:     Thyroid : No thyromegaly.  Cardiovascular:     Rate and Rhythm: Normal rate and regular rhythm.     Heart sounds: S1 normal and S2 normal. No murmur heard. Pulmonary:  Effort: Pulmonary effort is normal.     Breath sounds: Normal breath sounds and air entry. No rales.  Abdominal:     General: Abdomen is flat. Bowel sounds are normal.  Musculoskeletal:     Right lower leg: No edema.     Left lower leg: No edema.  Lymphadenopathy:     Cervical: No cervical adenopathy.  Neurological:     General: No focal deficit present.     Mental Status: He is alert and oriented to person, place, and time.     Gait: Gait is intact.  Psychiatric:        Mood and Affect: Mood and affect normal.      No results found for any visits on 08/16/24.     Assessment & Plan:    Routine Health Maintenance and Physical Exam  Immunization History  Administered Date(s) Administered   DTaP 07/14/2000, 09/22/2000, 11/15/2000, 12/01/2001   Dtap, Unspecified 05/14/2004   Fluzone Influenza virus vaccine,trivalent (IIV3), split virus 11/17/2011, 11/10/2012   HIB,  Unspecified 07/14/2000, 09/22/2000, 11/15/2000, 12/01/2001   Hep B, Unspecified 2000-06-07, 06/15/2000, 12/22/2000   IPV 07/14/2000, 09/22/2000, 05/13/2001   Influenza, Seasonal, Injecte, Preservative Fre 09/16/2009, 09/19/2010, 09/13/2023, 08/16/2024   Influenza,inj,Quad PF,6+ Mos 09/05/2015, 10/29/2017, 12/08/2018, 09/15/2021   MMR 05/13/2001, 05/14/2004   PFIZER Comirnaty(Gray Top)Covid-19 Tri-Sucrose Vaccine 02/01/2020, 02/23/2020   PFIZER(Purple Top)SARS-COV-2 Vaccination 02/01/2020, 02/23/2020   Pneumococcal Conjugate PCV 7 07/14/2000, 09/22/2000, 11/15/2000, 12/01/2001   Polio, Unspecified 05/14/2004   Tdap 04/30/2011, 06/01/2019   Varicella 05/13/2001    Health Maintenance  Topic Date Due   HPV VACCINES (1 - Male 3-dose series) Never done   COVID-19 Vaccine (5 - 2025-26 season) 07/03/2024   DTaP/Tdap/Td (8 - Td or Tdap) 05/31/2029   Influenza Vaccine  Completed   Hepatitis C Screening  Completed   HIV Screening  Completed   Pneumococcal Vaccine  Aged Out   Meningococcal B Vaccine  Aged Out    Discussed health benefits of physical activity, and encouraged him to engage in regular exercise appropriate for his age and condition.  Immunization due -     Flu vaccine trivalent PF, 6mos and older(Flulaval,Afluria,Fluarix,Fluzone) -     HPV 9-valent vaccine,Recombinat  Lipid screening -     Lipid panel  Routine general medical examination at a health care facility -     CBC with Differential/Platelet -     Comprehensive metabolic panel with GFR   General physical exam findings are normal today. I reviewed the patient's preventative testing, immunizations, and lifestyle habits. I made appropriate recommendations and placed orders for the appropriate tests and/or vaccinations. I counseled the patient on the CDC's recommendations for healthy exercise and diet. I counseled the patient on healthy sleep habits and stress management. Handouts to reinforce the counseling were given at  the conclusion of the visit.   Return in 1 year (on 08/16/2025).     James CHRISTELLA Sharper, MD

## 2024-08-16 NOTE — Patient Instructions (Addendum)
 Debrox ear drops 2-3 times per week   2nd HPV vaccine will be in 1 month, 3rd vaccine will be in 6 months  Health Maintenance, Male Adopting a healthy lifestyle and getting preventive care are important in promoting health and wellness. Ask your health care provider about: The right schedule for you to have regular tests and exams. Things you can do on your own to prevent diseases and keep yourself healthy. What should I know about diet, weight, and exercise? Eat a healthy diet  Eat a diet that includes plenty of vegetables, fruits, low-fat dairy products, and lean protein. Do not eat a lot of foods that are high in solid fats, added sugars, or sodium. Maintain a healthy weight Body mass index (BMI) is a measurement that can be used to identify possible weight problems. It estimates body fat based on height and weight. Your health care provider can help determine your BMI and help you achieve or maintain a healthy weight. Get regular exercise Get regular exercise. This is one of the most important things you can do for your health. Most adults should: Exercise for at least 150 minutes each week. The exercise should increase your heart rate and make you sweat (moderate-intensity exercise). Do strengthening exercises at least twice a week. This is in addition to the moderate-intensity exercise. Spend less time sitting. Even light physical activity can be beneficial. Watch cholesterol and blood lipids Have your blood tested for lipids and cholesterol at 24 years of age, then have this test every 5 years. You may need to have your cholesterol levels checked more often if: Your lipid or cholesterol levels are high. You are older than 24 years of age. You are at high risk for heart disease. What should I know about cancer screening? Many types of cancers can be detected early and may often be prevented. Depending on your health history and family history, you may need to have cancer screening at  various ages. This may include screening for: Colorectal cancer. Prostate cancer. Skin cancer. Lung cancer. What should I know about heart disease, diabetes, and high blood pressure? Blood pressure and heart disease High blood pressure causes heart disease and increases the risk of stroke. This is more likely to develop in people who have high blood pressure readings or are overweight. Talk with your health care provider about your target blood pressure readings. Have your blood pressure checked: Every 3-5 years if you are 51-44 years of age. Every year if you are 65 years old or older. If you are between the ages of 25 and 8 and are a current or former smoker, ask your health care provider if you should have a one-time screening for abdominal aortic aneurysm (AAA). Diabetes Have regular diabetes screenings. This checks your fasting blood sugar level. Have the screening done: Once every three years after age 33 if you are at a normal weight and have a low risk for diabetes. More often and at a younger age if you are overweight or have a high risk for diabetes. What should I know about preventing infection? Hepatitis B If you have a higher risk for hepatitis B, you should be screened for this virus. Talk with your health care provider to find out if you are at risk for hepatitis B infection. Hepatitis C Blood testing is recommended for: Everyone born from [birth year] through 1966. Anyone with known risk factors for hepatitis C. Sexually transmitted infections (STIs) You should be screened each year for STIs, including gonorrhea  and chlamydia, if: You are sexually active and are younger than 24 years of age. You are older than 24 years of age and your health care provider tells you that you are at risk for this type of infection. Your sexual activity has changed since you were last screened, and you are at increased risk for chlamydia or gonorrhea. Ask your health care provider if you are at  risk. Ask your health care provider about whether you are at high risk for HIV. Your health care provider may recommend a prescription medicine to help prevent HIV infection. If you choose to take medicine to prevent HIV, you should first get tested for HIV. You should then be tested every 3 months for as long as you are taking the medicine. Follow these instructions at home: Alcohol use Do not drink alcohol if your health care provider tells you not to drink. If you drink alcohol: Limit how much you have to 0-2 drinks a day. Know how much alcohol is in your drink. In the [place] one drink equals one 12 oz bottle of beer (355 mL), one 5 oz glass of wine (148 mL), or one 1 oz glass of hard liquor (44 mL). Lifestyle Do not use any products that contain nicotine or tobacco. These products include cigarettes, chewing tobacco, and vaping devices, such as e-cigarettes. If you need help quitting, ask your health care provider. Do not use street drugs. Do not share needles. Ask your health care provider for help if you need support or information about quitting drugs. General instructions Schedule regular health, dental, and eye exams. Stay current with your vaccines. Tell your health care provider if: You often feel depressed. You have ever been abused or do not feel safe at home. Summary Adopting a healthy lifestyle and getting preventive care are important in promoting health and wellness. Follow your health care provider's instructions about healthy diet, exercising, and getting tested or screened for diseases. Follow your health care provider's instructions on monitoring your cholesterol and blood pressure. This information is not intended to replace advice given to you by your health care provider. Make sure you discuss any questions you have with your health care provider. Document Revised: 03/10/2021 Document Reviewed: 03/10/2021 Elsevier Patient Education  2024 [employer].

## 2024-08-19 ENCOUNTER — Ambulatory Visit
Admission: EM | Admit: 2024-08-19 | Discharge: 2024-08-19 | Disposition: A | Discharge status: Home / Self Care | Attending: Family Medicine | Admitting: Family Medicine

## 2024-08-19 DIAGNOSIS — H6121 Impacted cerumen, right ear: Secondary | ICD-10-CM | POA: Diagnosis not present

## 2024-08-19 NOTE — ED Provider Notes (Signed)
 [facility]    CSN: [id number] Arrival date & time: 08/19/24  1124      History   Chief Complaint Chief Complaint  Patient presents with   Ear Fullness    HPI Augie Vane is a 23 y.o. male presents for ear fullness.  Patient reports a week of right ear fullness with muffled hearing.  States he had a physical with his PCP who looked in his ear and advised to use earwax softening drops which he has been using without improvement.  Denies pain drainage fevers or URI symptoms.  Has a history of earwax impaction in the past.  No other concerns at this time   Ear Fullness    Past Medical History:  Diagnosis Date   Asthma    Depression    History of chickenpox     Patient Active Problem List   Diagnosis Date Noted   Depression 04/05/2023   Closed fracture of right distal radius 06/07/2019    Past Surgical History:  Procedure Laterality Date   DENTAL SURGERY         Home Medications    Prior to Admission medications   Medication Sig Start Date End Date Taking? Authorizing Provider  allopurinol  (ZYLOPRIM ) 300 MG tablet Take 1 tablet (300 mg total) by mouth daily. 08/11/24   Ozell Heron HERO, MD  colchicine  0.6 MG tablet Day 1: Take 2 tablets, then 1 tablet an hour later. Day 2 and beyond: 1 tab daily. 07/11/24   Ozell Heron HERO, MD    Family History Family History  Adopted: Yes  Problem Relation Age of Onset   Mental illness Sister     Social History Social History   Tobacco Use   Smoking status: Never   Smokeless tobacco: Never  Vaping Use   Vaping status: Every Day  Substance Use Topics   Alcohol use: Yes   Drug use: Not Currently    Types: Marijuana     Allergies   Patient has no known allergies.   Review of Systems Review of Systems  HENT:         Right ear fullness     Physical Exam Triage Vital Signs ED Triage Vitals  Encounter Vitals Group     BP 08/19/24 1223 (!) 138/91     Girls Systolic BP Percentile --       Girls Diastolic BP Percentile --      Boys Systolic BP Percentile --      Boys Diastolic BP Percentile --      Pulse Rate 08/19/24 1221 90     Resp 08/19/24 1221 17     Temp 08/19/24 1221 98.1 F (36.7 C)     Temp src --      SpO2 08/19/24 1221 95 %     Weight --      Height --      Head Circumference --      Peak Flow --      Pain Score 08/19/24 1221 0     Pain Loc --      Pain Education --      Exclude from Growth Chart --    No data found.  Updated Vital Signs BP (!) 138/91   Pulse 90   Temp 98.1 F (36.7 C)   Resp 17   SpO2 95%   Visual Acuity Right Eye Distance:   Left Eye Distance:   Bilateral Distance:    Right Eye Near:   Left Eye Near:  Bilateral Near:     Physical Exam Vitals and nursing note reviewed.  Constitutional:      General: He is not in acute distress.    Appearance: Normal appearance. He is not ill-appearing.  HENT:     Head: Normocephalic and atraumatic.     Right Ear: There is impacted cerumen.     Left Ear: Tympanic membrane and ear canal normal.     Ears:     Comments: After irrigation TM and canal visible and within normal limits. Eyes:     Pupils: Pupils are equal, round, and reactive to light.  Cardiovascular:     Rate and Rhythm: Normal rate.  Pulmonary:     Effort: Pulmonary effort is normal.  Skin:    General: Skin is warm and dry.  Neurological:     General: No focal deficit present.     Mental Status: He is alert and oriented to person, place, and time.  Psychiatric:        Mood and Affect: Mood normal.        Behavior: Behavior normal.      UC Treatments / Results  Labs (all labs ordered are listed, but only abnormal results are displayed) Labs Reviewed - No data to display  EKG   Radiology No results found.  Procedures Procedures (including critical care time)  Medications Ordered in UC Medications - No data to display  Initial Impression / Assessment and Plan / UC Course  I have reviewed the  triage vital signs and the nursing notes.  Pertinent labs & imaging results that were available during my care of the patient were reviewed by me and considered in my medical decision making (see chart for details).     Patient tolerated irrigation well.  TM and canal within normal limits after irrigation.  Advised to avoid Q-tips or earbuds and to follow-up as needed. Final Clinical Impressions(s) / UC Diagnoses   Final diagnoses:  Impacted cerumen of right ear     Discharge Instructions      Avoid Q-tips and earbuds and follow-up as needed     ED Prescriptions   None    PDMP not reviewed this encounter.   Loreda Myla SAUNDERS, NP 08/19/24 1248

## 2024-08-19 NOTE — Discharge Instructions (Signed)
 Avoid Q-tips and earbuds and follow-up as needed

## 2024-08-19 NOTE — ED Triage Notes (Signed)
 Pt present with rt ear fullness x one week. Pt states he used ear drops, and has only been feeling worse and more full.

## 2024-08-22 ENCOUNTER — Ambulatory Visit: Payer: Self-pay | Admitting: Family Medicine
